# Patient Record
Sex: Male | Born: 1965 | Race: White | Hispanic: Yes | Smoking: Current every day smoker
Health system: Southern US, Community
[De-identification: ages and names within clinical notes are randomized; demographics above are authoritative.]

## PROBLEM LIST (undated history)

## (undated) DIAGNOSIS — G4733 Obstructive sleep apnea (adult) (pediatric): Secondary | ICD-10-CM

## (undated) DIAGNOSIS — I10 Essential (primary) hypertension: Secondary | ICD-10-CM

## (undated) DIAGNOSIS — E7800 Pure hypercholesterolemia, unspecified: Secondary | ICD-10-CM

## (undated) DIAGNOSIS — J45909 Unspecified asthma, uncomplicated: Secondary | ICD-10-CM

## (undated) DIAGNOSIS — F419 Anxiety disorder, unspecified: Secondary | ICD-10-CM

## (undated) DIAGNOSIS — F32A Depression, unspecified: Secondary | ICD-10-CM

## (undated) DIAGNOSIS — I4891 Unspecified atrial fibrillation: Secondary | ICD-10-CM

## (undated) DIAGNOSIS — G4730 Sleep apnea, unspecified: Secondary | ICD-10-CM

## (undated) HISTORY — PX: APPENDECTOMY: SHX54

## (undated) HISTORY — PX: CHOLECYSTECTOMY: SHX55

## (undated) HISTORY — DX: Unspecified asthma, uncomplicated: J45.909

## (undated) HISTORY — PX: TOTAL SHOULDER REPLACEMENT: SUR1217

## (undated) HISTORY — DX: Sleep apnea, unspecified: G47.30

## (undated) HISTORY — DX: Depression, unspecified: F32.A

---

## 2014-05-02 ENCOUNTER — Emergency Department (HOSPITAL_BASED_OUTPATIENT_CLINIC_OR_DEPARTMENT_OTHER): Payer: 59

## 2014-05-02 ENCOUNTER — Observation Stay (HOSPITAL_BASED_OUTPATIENT_CLINIC_OR_DEPARTMENT_OTHER)
Admission: EM | Admit: 2014-05-02 | Discharge: 2014-05-03 | Disposition: A | Discharge status: Home / Self Care | Payer: 59 | Attending: Cardiovascular Disease | Admitting: Cardiovascular Disease

## 2014-05-02 ENCOUNTER — Encounter (HOSPITAL_BASED_OUTPATIENT_CLINIC_OR_DEPARTMENT_OTHER): Payer: Self-pay | Admitting: Emergency Medicine

## 2014-05-02 DIAGNOSIS — I480 Paroxysmal atrial fibrillation: Secondary | ICD-10-CM | POA: Diagnosis not present

## 2014-05-02 DIAGNOSIS — F15920 Other stimulant use, unspecified with intoxication, uncomplicated: Secondary | ICD-10-CM | POA: Insufficient documentation

## 2014-05-02 DIAGNOSIS — Z6834 Body mass index (BMI) 34.0-34.9, adult: Secondary | ICD-10-CM | POA: Diagnosis not present

## 2014-05-02 DIAGNOSIS — E780 Pure hypercholesterolemia: Secondary | ICD-10-CM | POA: Diagnosis not present

## 2014-05-02 DIAGNOSIS — F17210 Nicotine dependence, cigarettes, uncomplicated: Secondary | ICD-10-CM | POA: Diagnosis not present

## 2014-05-02 DIAGNOSIS — E669 Obesity, unspecified: Secondary | ICD-10-CM | POA: Diagnosis present

## 2014-05-02 DIAGNOSIS — I4891 Unspecified atrial fibrillation: Secondary | ICD-10-CM | POA: Diagnosis present

## 2014-05-02 DIAGNOSIS — Z720 Tobacco use: Secondary | ICD-10-CM | POA: Diagnosis present

## 2014-05-02 DIAGNOSIS — I499 Cardiac arrhythmia, unspecified: Secondary | ICD-10-CM | POA: Diagnosis present

## 2014-05-02 DIAGNOSIS — F419 Anxiety disorder, unspecified: Secondary | ICD-10-CM | POA: Diagnosis not present

## 2014-05-02 DIAGNOSIS — E785 Hyperlipidemia, unspecified: Secondary | ICD-10-CM | POA: Diagnosis present

## 2014-05-02 DIAGNOSIS — R002 Palpitations: Secondary | ICD-10-CM

## 2014-05-02 DIAGNOSIS — Z789 Other specified health status: Secondary | ICD-10-CM | POA: Diagnosis present

## 2014-05-02 HISTORY — DX: Pure hypercholesterolemia, unspecified: E78.00

## 2014-05-02 HISTORY — DX: Anxiety disorder, unspecified: F41.9

## 2014-05-02 HISTORY — DX: Unspecified atrial fibrillation: I48.91

## 2014-05-02 LAB — BASIC METABOLIC PANEL
Anion gap: 16 — ABNORMAL HIGH (ref 5–15)
BUN: 19 mg/dL (ref 6–23)
CO2: 23 mEq/L (ref 19–32)
CREATININE: 1 mg/dL (ref 0.50–1.35)
Calcium: 9.5 mg/dL (ref 8.4–10.5)
Chloride: 101 mEq/L (ref 96–112)
GFR calc Af Amer: >90 mL/min (ref >90)
GFR calc non Af Amer: 89 mL/min — ABNORMAL LOW (ref >90)
Glucose, Bld: 137 mg/dL — ABNORMAL HIGH (ref 70–99)
Potassium: 4.1 mEq/L (ref 3.7–5.3)
Sodium: 140 mEq/L (ref 137–147)

## 2014-05-02 LAB — CBC WITH DIFFERENTIAL/PLATELET
Basophils Absolute: 0 10*3/uL (ref 0.0–0.1)
Basophils Relative: 0 % (ref 0–1)
Eosinophils Absolute: 0.1 10*3/uL (ref 0.0–0.7)
Eosinophils Relative: 1 % (ref 0–5)
HCT: 46.4 % (ref 39.0–52.0)
HEMOGLOBIN: 16.5 g/dL (ref 13.0–17.0)
LYMPHS ABS: 4.4 10*3/uL — AB (ref 0.7–4.0)
LYMPHS PCT: 47 % — AB (ref 12–46)
MCH: 30.9 pg (ref 26.0–34.0)
MCHC: 35.6 g/dL (ref 30.0–36.0)
MCV: 86.9 fL (ref 78.0–100.0)
MONO ABS: 0.6 10*3/uL (ref 0.1–1.0)
MONOS PCT: 6 % (ref 3–12)
NEUTROS ABS: 4.4 10*3/uL (ref 1.7–7.7)
Neutrophils Relative %: 46 % (ref 43–77)
Platelets: 272 10*3/uL (ref 150–400)
RBC: 5.34 MIL/uL (ref 4.22–5.81)
RDW: 12.9 % (ref 11.5–15.5)
WBC: 9.5 10*3/uL (ref 4.0–10.5)

## 2014-05-02 LAB — TROPONIN I: Troponin I: <0.3 ng/mL (ref <0.30)

## 2014-05-02 LAB — MRSA PCR SCREENING: MRSA BY PCR: NEGATIVE

## 2014-05-02 LAB — T4, FREE: Free T4: 0.93 ng/dL (ref 0.80–1.80)

## 2014-05-02 LAB — PRO B NATRIURETIC PEPTIDE: Pro B Natriuretic peptide (BNP): 656.8 pg/mL — ABNORMAL HIGH (ref 0–125)

## 2014-05-02 LAB — D-DIMER, QUANTITATIVE: D-Dimer, Quant: <0.27 ug/mL-FEU (ref 0.00–0.48)

## 2014-05-02 LAB — TSH: TSH: 1.55 u[IU]/mL (ref 0.350–4.500)

## 2014-05-02 MED ORDER — DILTIAZEM HCL 25 MG/5ML IV SOLN
20.0000 mg | Freq: Once | INTRAVENOUS | Status: AC
  Administered 2014-05-02: 20 mg via INTRAVENOUS
  Filled 2014-05-02: qty 5

## 2014-05-02 MED ORDER — DILTIAZEM HCL 100 MG IV SOLR
5.0000 mg/h | INTRAVENOUS | Status: DC
  Administered 2014-05-02: 2.5 mg/h via INTRAVENOUS

## 2014-05-02 MED ORDER — ACETAMINOPHEN 325 MG PO TABS
650.0000 mg | ORAL_TABLET | Freq: Once | ORAL | Status: AC
  Administered 2014-05-02: 650 mg via ORAL
  Filled 2014-05-02: qty 2

## 2014-05-02 MED ORDER — ATORVASTATIN CALCIUM 20 MG PO TABS
20.0000 mg | ORAL_TABLET | Freq: Every day | ORAL | Status: DC
  Filled 2014-05-02: qty 1

## 2014-05-02 MED ORDER — ASPIRIN EC 81 MG PO TBEC
81.0000 mg | DELAYED_RELEASE_TABLET | Freq: Every day | ORAL | Status: DC
  Administered 2014-05-03: 81 mg via ORAL
  Filled 2014-05-02: qty 1

## 2014-05-02 MED ORDER — SODIUM CHLORIDE 0.9 % IV SOLN
Freq: Once | INTRAVENOUS | Status: AC
  Administered 2014-05-02: 16:00:00 via INTRAVENOUS

## 2014-05-02 MED ORDER — ASPIRIN 81 MG PO CHEW
324.0000 mg | CHEWABLE_TABLET | Freq: Once | ORAL | Status: AC
  Administered 2014-05-02: 324 mg via ORAL
  Filled 2014-05-02: qty 4

## 2014-05-02 MED ORDER — DILTIAZEM HCL 25 MG/5ML IV SOLN
INTRAVENOUS | Status: AC
  Filled 2014-05-02: qty 20

## 2014-05-02 MED ORDER — SODIUM CHLORIDE 0.9 % IV BOLUS (SEPSIS)
1000.0000 mL | Freq: Once | INTRAVENOUS | Status: AC
  Administered 2014-05-02: 1000 mL via INTRAVENOUS

## 2014-05-02 MED ORDER — ENOXAPARIN SODIUM 120 MG/0.8ML ~~LOC~~ SOLN
1.0000 mg/kg | Freq: Once | SUBCUTANEOUS | Status: AC
  Administered 2014-05-02: 115 mg via SUBCUTANEOUS
  Filled 2014-05-02: qty 0.8

## 2014-05-02 MED ORDER — ENOXAPARIN SODIUM 120 MG/0.8ML ~~LOC~~ SOLN
115.0000 mg | Freq: Two times a day (BID) | SUBCUTANEOUS | Status: DC
  Administered 2014-05-03: 115 mg via SUBCUTANEOUS
  Filled 2014-05-02 (×3): qty 0.8

## 2014-05-02 MED ORDER — PAROXETINE HCL 20 MG PO TABS
40.0000 mg | ORAL_TABLET | ORAL | Status: DC
  Administered 2014-05-03: 40 mg via ORAL
  Filled 2014-05-02 (×2): qty 2

## 2014-05-02 NOTE — ED Notes (Signed)
EDP Pfeiffer notified of pt's report of chest pain- EKG ordered and in process

## 2014-05-02 NOTE — ED Provider Notes (Signed)
CSN: [id number]     Arrival date & time 05/02/14  1243 History   First MD Initiated Contact with Patient 05/02/14 1300     Chief Complaint  Patient presents with  . Irregular Heart Beat     (Consider location/radiation/quality/duration/timing/severity/associated sxs/prior Treatment) HPI Comments: 2 days of constant tachypalpitations. He has had chronic unchanged neck/back pain she believes is radiating up to his head and causing a headache. He has had generalized fatigue, no shortness of breath this is unchanged with exertion or rest. Around 10 AM he had one to 2 seconds of a sharp midsternal chest pain which has since resolved and has not returned. Denies nausea, vomiting, diaphoresis, or extremity pain or swelling. He has had no recent prolonged travel. He smokes occasionally. He drinks multiple caffeinated beverages a day. He denies IV drug use. His no family history of PE/DVT, cardiac arrhythmia.   Past Medical History  Diagnosis Date  . Anxiety   . Elevated cholesterol    Past Surgical History  Procedure Laterality Date  . Appendectomy    . Cholecystectomy     No family history on file. History  Substance Use Topics  . Smoking status: Current Some Day Smoker  . Smokeless tobacco: Not on file  . Alcohol Use: Yes     Comment: occassionally    Review of Systems  Constitutional: Negative for fever, activity change, appetite change and fatigue.  HENT: Negative for congestion, facial swelling, rhinorrhea and trouble swallowing.   Eyes: Negative for photophobia and pain.  Respiratory: Positive for shortness of breath. Negative for cough and chest tightness.   Cardiovascular: Positive for chest pain and palpitations. Negative for leg swelling.  Gastrointestinal: Negative for nausea, vomiting, abdominal pain, diarrhea and constipation.  Endocrine: Negative for polydipsia and polyuria.  Genitourinary: Negative for dysuria, urgency, decreased urine volume and difficulty urinating.   Musculoskeletal: Negative for back pain and gait problem.  Skin: Negative for color change, rash and wound.  Allergic/Immunologic: Negative for immunocompromised state.  Neurological: Negative for dizziness, facial asymmetry, speech difficulty, weakness, numbness and headaches.  Psychiatric/Behavioral: Negative for confusion, decreased concentration and agitation.      Allergies  Review of patient's allergies indicates no known allergies.  Home Medications   Prior to Admission medications   Medication Sig Start Date End Date Taking? Authorizing Provider  Atorvastatin Calcium (LIPITOR PO) Take by mouth.   Yes Historical Provider, MD  PARoxetine (PAXIL) 40 MG tablet Take 40 mg by mouth every morning.   Yes Historical Provider, MD   BP 147/87  Pulse 88  Temp(Src) 98.8 F (37.1 C) (Oral)  Resp 13  Ht 5\' 11"  (1.803 m)  Wt 250 lb (113.399 kg)  BMI 34.88 kg/m2  SpO2 100% Physical Exam  Constitutional: He is oriented to person, place, and time. He appears well-developed and well-nourished. No distress.  HENT:  Head: Normocephalic and atraumatic.  Mouth/Throat: No oropharyngeal exudate.  Eyes: Pupils are equal, round, and reactive to light.  Neck: Normal range of motion. Neck supple.  Cardiovascular: Normal heart sounds.  An irregularly irregular rhythm present. Tachycardia present.  Exam reveals no gallop and no friction rub.   No murmur heard. Pulmonary/Chest: Effort normal and breath sounds normal. No respiratory distress. He has no wheezes. He has no rales.  Abdominal: Soft. Bowel sounds are normal. He exhibits no distension and no mass. There is no tenderness. There is no rebound and no guarding.  Musculoskeletal: Normal range of motion. He exhibits no edema and no tenderness.  Neurological: He is alert and oriented to person, place, and time.  Skin: Skin is warm and dry.  Psychiatric: He has a normal mood and affect.    ED Course  Procedures (including critical care  time) Labs Review Labs Reviewed  CBC WITH DIFFERENTIAL - Abnormal; Notable for the following:    Lymphocytes Relative 47 (*)    Lymphs Abs 4.4 (*)    All other components within normal limits  BASIC METABOLIC PANEL - Abnormal; Notable for the following:    Glucose, Bld 137 (*)    GFR calc non Af Amer 89 (*)    Anion gap 16 (*)    All other components within normal limits  PRO B NATRIURETIC PEPTIDE - Abnormal; Notable for the following:    Pro B Natriuretic peptide (BNP) 656.8 (*)    All other components within normal limits  TROPONIN I  T4, FREE  D-DIMER, QUANTITATIVE  TSH    Imaging Review Dg Chest 2 View  05/02/2014   CLINICAL DATA:  Sharp chest pain with difficulty breathing and tachycardia for 2 days  EXAM: CHEST  2 VIEW  COMPARISON:  None.  FINDINGS: The lungs are clear. Heart size and pulmonary vascularity are normal. No adenopathy. No pneumothorax. No bone lesions.  IMPRESSION: No edema or consolidation.   Electronically Signed   By: Bretta BangWilliam  Woodruff M.D.   On: 05/02/2014 13:40     EKG Interpretation   Date/Time:  Monday May 02 2014 12:50:22 EDT Ventricular Rate:  159 PR Interval:    QRS Duration: 90 QT Interval:  284 QTC Calculation: 461 R Axis:   69 Text Interpretation:  Atrial fibrillation with rapid ventricular response  Abnormal ECG No prior for comparison Confirmed by DOCHERTY  MD, MEGAN  ([id number]) on 05/02/2014 1:00:04 PM      MDM   Final diagnoses:  Palpitations  Atrial fibrillation with rapid ventricular response   Pt is a 48 y.o. male with Pmhx as above who presents with palpitations, mild shortness of breath and fatigue. This is no worse with exertion. Around 10:00 today at rest he had one to 2 seconds worth of sharp chest pain that has since resolved. On exam heart rate is 120s to 150s with atrial fibrillation. Lungs clear. No lower extremity pain or swelling. He is an occasional smoker, otherwise has no risk factors for PE. IV fluid bolus given  without improvement. CBC grossly unremarkable. BMP also grossly unremarkable. Troponin is negative. BNP is 656.8. Chest x-ray nml. D-dimer neg. TSH and free T4 sent. Rate improved to 80=90's with 20mg  IV dil and IVF bolus, remains in afib. I spoke w/ Dr. Elease HashimotoNahser with cardiology who recommended starting diltiazem drip and admitted to step down on Mrs. Cohen. Would also recommend starting Eliquis, however we do not have this at our facility.       Toy CookeyMegan Docherty, MD 05/02/14 1447

## 2014-05-02 NOTE — H&P (Signed)
History and Physical  Patient ID: Samuel Patel MRN: [id number], SOB: 06/10/1966 48 y.o. Date of Encounter: 05/02/2014, 9:26 PM  Primary Physician: [name], MD Primary Cardiologist: unassigned  Chief Complaint: palpitations  HPI: 48 y.o. male w/ PMHx significant for hyperlipidemia who [facility] 05[facility]2014 with complaints of palpitations. He notes that these started on Saturday while cooking a cake. Felt his heart fluttering and noted that his breathing was a little different. Symptoms persisted prompting a visit to [facility] today.  Previously, has had short runs of palpitations that he had evaluated 3 years ago with a holter and an echo (he reports these were normal).  No family history of arrhythmias or heart problems. ETOH of `1-2 max drinks on a Friday. 1/4 ppd tobacco. Denies illicits of cocaine or meth. Does drink excessive caffeine of over 8 cups per day equivalent of coffee and diet soda.   EKG revealed afib with RVR. CXR was without acute cardiopulmonary abnormalities. Labs are significant for nl D-dimer and TSH.  Currently on dilt gtt at 5, rates of 90s.   Past Medical History  Diagnosis Date  . Anxiety   . Elevated cholesterol      Surgical History:  Past Surgical History  Procedure Laterality Date  . Appendectomy    . Cholecystectomy       Home Meds: Prior to Admission medications   Medication Sig Start Date End Date Taking? Authorizing Provider  Atorvastatin Calcium (LIPITOR PO) Take by mouth.   Yes Historical Provider, MD  PARoxetine (PAXIL) 40 MG tablet Take 40 mg by mouth every morning.   Yes Historical Provider, MD    Allergies: No Known Allergies  History   Social History  . Marital Status: Divorced    Spouse Name: N/A    Number of Children: N/A  . Years of Education: N/A   Occupational History  . Not on file.   Social History Main Topics  . Smoking status: Current Some Day Smoker  . Smokeless  tobacco: Not on file  . Alcohol Use: Yes     Comment: occassionally  . Drug Use: No  . Sexual Activity: Not on file   Other Topics Concern  . Not on file   Social History Narrative  . No narrative on file     FH: neg for CV dz.  Review of Systems: General: negative for chills, fever, night sweats or weight changes.  Cardiovascular: see HPI Dermatological: negative for rash Respiratory: negative for cough or wheezing Urologic: negative for hematuria Abdominal: negative for nausea, vomiting, diarrhea, bright red blood per rectum, melena, or hematemesis Neurologic: negative for visual changes, syncope, or dizziness All other systems reviewed and are otherwise negative except as noted above.  Labs:   Lab Results  Component Value Date   WBC 9.5 05/02/2014   HGB 16.5 05/02/2014   HCT 46.4 05/02/2014   MCV 86.9 05/02/2014   PLT 272 05/02/2014    Recent Labs Lab 05/02/14 1255  NA 140  K 4.1  CL 101  CO2 23  BUN 19  CREATININE 1.00  CALCIUM 9.5  GLUCOSE 137*    Recent Labs  05/02/14 1255 05/02/14 1740  TROPONINI <0.30 <0.30   No results found for this basename: CHOL, HDL, LDLCALC, TRIG   Lab Results  Component Value Date   DDIMER <0.27 05/02/2014    Radiology/Studies:  Dg Chest 2 View  05/02/2014   CLINICAL DATA:  Sharp chest pain with difficulty breathing and  tachycardia for 2 days  EXAM: CHEST  2 VIEW  COMPARISON:  None.  FINDINGS: The lungs are clear. Heart size and pulmonary vascularity are normal. No adenopathy. No pneumothorax. No bone lesions.  IMPRESSION: No edema or consolidation.   Electronically Signed   By: Bretta BangWilliam  Woodruff M.D.   On: 05/02/2014 13:40     EKG: afib with RVR  Physical Exam: Blood pressure 127/94, pulse 106, temperature 98 F (36.7 C), temperature source Oral, resp. rate 14, height 5\' 11"  (1.803 m), weight 113.399 kg (250 lb), SpO2 100.00%. General: Well developed, well nourished, in no acute distress. Head: Normocephalic,  atraumatic, sclera non-icteric, nares are without discharge Neck: Supple. Negative for carotid bruits. JVD not elevated. Lungs: Clear bilaterally to auscultation without wheezes, rales, or rhonchi. Breathing is unlabored. Heart: irreg, irreg. No murmurs, rubs, or gallops appreciated. Abdomen: Soft, non-tender, non-distended with normoactive bowel sounds. No rebound/guarding. No obvious abdominal masses. Msk:  Strength and tone appear normal for age. Extremities: No edema. No clubbing or cyanosis. Distal pedal pulses are 2+ and equal bilaterally. Neuro: Alert and oriented X 3. Moves all extremities spontaneously. Psych:  Responds to questions appropriately with a normal affect.    ASSESSMENT AND PLAN:  1. Afib with RVR 2. Hyperlipidemia 3. Anxiety 4. Excessive caffeine 5. Tobacco abuse  48 y.o. male w/ PMHx significant for hyperlipidemia who [facility]2014 with complaints of palpitations x 2 days+ --> found to be in afib with RVR.  Regarding etiology, preliminary workup is negative (d-dimer, thyroid). Interestingly, it sounds like he has had an echo in the past for palpitations workup which was normal. Repeat echo pending. The data regarding caffeine is not clear but 8 cups + is probably not helping his arrhythmia. Recommended cutting down to more reasonable amount.  At his age, preference for rhythm control. May convert on his own with diltiazem gtt. Due to greater than 2 days in length, will need TEE prior to cardioversion. Lovenox for anticoagulation for now. (Other strategy of 3 weeks anti-coag then DCCV also reasonable- discussed both strategies with patient). NPO in case procedure needed.  Smoking cessation strongly recommended.,  COntinue statin and paxil.  Full code.   Signed, Adolm JosephWHITLOCK, Troy SineMATTHEW C. MD 05/02/2014, 9:26 PM

## 2014-05-02 NOTE — ED Notes (Signed)
Patient transported to X-ray- RN with pt- pt on monitor

## 2014-05-02 NOTE — Progress Notes (Signed)
ANTICOAGULATION CONSULT NOTE - Initial Consult  Pharmacy Consult for Enoxaparin Indication: atrial fibrillation  No Known Allergies  Patient Measurements: Height: 5\' 11"  (180.3 cm) Weight: 250 lb (113.399 kg) IBW/kg (Calculated) : 75.3  Vital Signs: Temp: 98 F (36.7 C) (10/12 1945) Temp Source: Oral (10/12 1945) BP: 127/94 mmHg (10/12 1945) Pulse Rate: 106 (10/12 1945)  Labs:  Recent Labs  05/02/14 1255 05/02/14 1740  HGB 16.5  --   HCT 46.4  --   PLT 272  --   CREATININE 1.00  --   TROPONINI <0.30 <0.30    Estimated Creatinine Clearance: 118.2 ml/min (by C-G formula based on Cr of 1).   Medical History: Past Medical History  Diagnosis Date  . Anxiety   . Elevated cholesterol     Medications:  Prescriptions prior to admission  Medication Sig Dispense Refill  . Atorvastatin Calcium (LIPITOR PO) Take by mouth.      Marland Kitchen. PARoxetine (PAXIL) 40 MG tablet Take 40 mg by mouth every morning.        Assessment: 48 yo M admitted 05/02/2014 with 2d of a rapid heart rate.  Pharmacy consulted to start enoxaparin.  PMH: anxiety, hypercholesterolemia  Coag: Afib, to start enoxaparin, CrCl > 100 ml/min, CBC wnl  Goal of Therapy:  Anti-Xa level 0.6-1 units/ml 4hrs after LMWH dose given Monitor platelets by anticoagulation protocol: Yes   Plan:  Lovenox 115 mg SQ q12h CBC Q72h  Thank you for allowing pharmacy to be a part of this patients care team.  Lovenia KimJulie Theodoro Koval Pharm.D., BCPS, AQ-Cardiology Clinical Pharmacist 05/02/2014 9:52 PM Pager: [phone] Phone: [phone]

## 2014-05-02 NOTE — ED Notes (Signed)
MD at bedside. 

## 2014-05-02 NOTE — ED Notes (Signed)
Patient states he developed a rapid heart beat two days ago on Saturday.  States he also has had a headache for the last two days, shortness of breath, & fatigue.  States earlier today he had a sharp pain in the center of his chest which lasted for a few seconds.  Denies nausea or diaphoresis.

## 2014-05-02 NOTE — ED Notes (Signed)
Report given to [place], RN on [id number]

## 2014-05-02 NOTE — ED Notes (Signed)
Pt c/o increased palpitations, MD notified and informed to increase rate to 5

## 2014-05-02 NOTE — H&P (Signed)
The history and physical has been done by Dr. Adolm JosephWhitlock.

## 2014-05-02 NOTE — ED Notes (Signed)
MD notified about pts new cp

## 2014-05-02 NOTE — ED Notes (Addendum)
MD at bedside discussing admission

## 2014-05-02 NOTE — ED Notes (Signed)
Pt c/o headache which he states he has had pta- EDP notified

## 2014-05-02 NOTE — ED Notes (Signed)
Pt sts that his cp is less now than it was.

## 2014-05-02 NOTE — ED Provider Notes (Signed)
I taken over care of the patient as he awaits transfer for atrial fibrillation on Cardizem drip. Nursing reports that the patient complained of some central chest pressure, while heart rate and blood pressures remained stable. I have reassessed the patient and find him to be in good condition with excellent mental status, no respiratory distress, skin warm and dry. At this time I have added aspirin and reviewed the patient's repeat EKG. Repeat EKG shows atrial fibrillation but no ST-T wave changes or acute ischemic appearance. Reviewing the previous note, the patient was recommended to get Eliquis but this was not available at the facility. At this time I will add Lovenox and a repeat troponin.  Samuel BarretteMarcy Kadelyn Dimascio, MD 05/02/14 [phone]

## 2014-05-02 NOTE — ED Notes (Signed)
Attempted to call report to floor but nurse was unavailable.  They requested to call us back.

## 2014-05-03 ENCOUNTER — Encounter (HOSPITAL_COMMUNITY): Payer: Self-pay | Admitting: *Deleted

## 2014-05-03 DIAGNOSIS — E669 Obesity, unspecified: Secondary | ICD-10-CM | POA: Diagnosis not present

## 2014-05-03 DIAGNOSIS — I480 Paroxysmal atrial fibrillation: Secondary | ICD-10-CM | POA: Diagnosis not present

## 2014-05-03 DIAGNOSIS — E785 Hyperlipidemia, unspecified: Secondary | ICD-10-CM | POA: Diagnosis present

## 2014-05-03 DIAGNOSIS — E780 Pure hypercholesterolemia: Secondary | ICD-10-CM | POA: Diagnosis not present

## 2014-05-03 DIAGNOSIS — Z6834 Body mass index (BMI) 34.0-34.9, adult: Secondary | ICD-10-CM | POA: Diagnosis not present

## 2014-05-03 DIAGNOSIS — I4891 Unspecified atrial fibrillation: Secondary | ICD-10-CM

## 2014-05-03 DIAGNOSIS — F419 Anxiety disorder, unspecified: Secondary | ICD-10-CM | POA: Diagnosis present

## 2014-05-03 DIAGNOSIS — Z789 Other specified health status: Secondary | ICD-10-CM | POA: Diagnosis present

## 2014-05-03 DIAGNOSIS — Z720 Tobacco use: Secondary | ICD-10-CM | POA: Diagnosis present

## 2014-05-03 DIAGNOSIS — I517 Cardiomegaly: Secondary | ICD-10-CM

## 2014-05-03 LAB — CBC
HCT: 40.7 % (ref 39.0–52.0)
Hemoglobin: 14.4 g/dL (ref 13.0–17.0)
MCH: 31.2 pg (ref 26.0–34.0)
MCHC: 35.4 g/dL (ref 30.0–36.0)
MCV: 88.3 fL (ref 78.0–100.0)
Platelets: 217 10*3/uL (ref 150–400)
RBC: 4.61 MIL/uL (ref 4.22–5.81)
RDW: 13.1 % (ref 11.5–15.5)
WBC: 9.1 10*3/uL (ref 4.0–10.5)

## 2014-05-03 LAB — BASIC METABOLIC PANEL
Anion gap: 12 (ref 5–15)
BUN: 13 mg/dL (ref 6–23)
CO2: 23 mEq/L (ref 19–32)
Calcium: 8.6 mg/dL (ref 8.4–10.5)
Chloride: 102 mEq/L (ref 96–112)
Creatinine, Ser: 0.89 mg/dL (ref 0.50–1.35)
GFR calc Af Amer: >90 mL/min (ref >90)
GLUCOSE: 108 mg/dL — AB (ref 70–99)
POTASSIUM: 3.9 meq/L (ref 3.7–5.3)
Sodium: 137 mEq/L (ref 137–147)

## 2014-05-03 LAB — HEMOGLOBIN A1C
Hgb A1c MFr Bld: 5.7 % — ABNORMAL HIGH (ref <5.7)
Mean Plasma Glucose: 117 mg/dL — ABNORMAL HIGH (ref <117)

## 2014-05-03 LAB — TROPONIN I: Troponin I: <0.3 ng/mL (ref <0.30)

## 2014-05-03 MED ORDER — ASPIRIN 81 MG PO TBEC: 81.0000 mg | DELAYED_RELEASE_TABLET | Freq: Every day | ORAL | Status: DC

## 2014-05-03 MED ORDER — ACETAMINOPHEN 325 MG PO TABS: 650.0000 mg | ORAL_TABLET | ORAL | Status: DC | PRN

## 2014-05-03 MED ORDER — METOPROLOL SUCCINATE 12.5 MG HALF TABLET
12.5000 mg | ORAL_TABLET | Freq: Every day | ORAL | Status: DC
  Administered 2014-05-03: 12.5 mg via ORAL
  Filled 2014-05-03: qty 1

## 2014-05-03 MED ORDER — METOPROLOL SUCCINATE ER 25 MG PO TB24
12.5000 mg | ORAL_TABLET | Freq: Every day | ORAL | Status: DC
Start: 2014-05-03 — End: 2014-06-03

## 2014-05-03 NOTE — Progress Notes (Signed)
UR completed 

## 2014-05-03 NOTE — Progress Notes (Signed)
  Echocardiogram 2D Echocardiogram has been performed.  Arvil ChacoFoster, Sheera Illingworth 05/03/2014, 11:23 AM

## 2014-05-03 NOTE — Discharge Summary (Signed)
Physician Discharge Summary     Cardiologist:  Brackbill(new) Patient ID: Samuel Patel MRN: [id number] DOB/AGE: 46/02/1966 46 y.o.  Admit date: 05/02/2014 Discharge date: 05/03/2014  Admission Diagnoses:  Atrial fibrillation with RVR  Discharge Diagnoses:  Principal Problem:   Atrial fibrillation with rapid ventricular response   Obesity   Tobacco abuse   HLD   Excessive Caffeine   Anxiety  Discharged Condition: stable  Hospital Course:   48 y.o. male w/ PMHx significant for hyperlipidemia and obesity who [facility] to [facility] on 05/02/2014 with complaints of palpitations. He notes that these started on Saturday while cooking a cake. Felt his heart fluttering and noted that his breathing was a little different. Symptoms persisted prompting a visit to [facility].  Previously, has had short runs of palpitations that he had evaluated 3 years ago with a holter and an echo (he reports these were normal).   No family history of arrhythmias or heart problems. ETOH of `1-2 max drinks on a Friday. 1/4 ppd tobacco. Denies illicits of cocaine or meth. Does drink excessive caffeine of over 8 cups per day equivalent of coffee and diet soda.  EKG revealed afib with RVR. CXR was without acute cardiopulmonary abnormalities. Labs are significant for nl D-dimer and TSH.   He was started on a diltiazem gtt at 5, and rates dropped to the 90s.    He was admitted and continued on IV diltiazem.  Lovenox was started and then change to 81mg  ASA.  CHADSVASC 0.  Tobacco cessation was strongly encouraged as was the importance of avoiding sweets and losing weight.  He spontaneously converted to NSR.  Dilt was changed to 12.5 mg Toprol daily.  2D echocardiogram revealed an EF of  55-60%, LA mildly dilated.  The patient was seen by Dr. Patty SermonsBrackbill who felt she was stable for DC home.     Consults:    Significant Diagnostic Studies:  Echocardiogram.     Treatments: See above  Discharge  Exam: Blood pressure 137/98, pulse 100, temperature 98.4 F (36.9 C), temperature source Oral, resp. rate 18, height 6' (1.829 m), weight 267 lb (121.11 kg), SpO2 97.00%.   Disposition: 01-Home or Self Care      Discharge Instructions   Diet - low sodium heart healthy    Complete by:  As directed             Medication List         aspirin 81 MG EC tablet  Take 1 tablet (81 mg total) by mouth daily.     atorvastatin 10 MG tablet  Commonly known as:  LIPITOR  Take 10 mg by mouth daily.     metoprolol succinate 25 MG 24 hr tablet  Commonly known as:  TOPROL-XL  Take 0.5 tablets (12.5 mg total) by mouth daily.     PARoxetine 40 MG tablet  Commonly known as:  PAXIL  Take 40 mg by mouth every morning.       Follow-up Information   Follow up with Cassell Clementhomas Brackbill, MD On 06/03/2014. (10:30 AM)    Specialty:  Cardiology   Contact information:   [address] [place] [address] [phone]      Greater than 30 minutes was spent completing the patient's discharge.   SignedWilburt Finlay: Alissia Lory, PAC 05/03/2014, 1:41 PM

## 2014-05-03 NOTE — Progress Notes (Signed)
DC orders received.  Patient stable with no S/S of distress.  Medication and discharge information reviewed with patient.  Patient DC home. Samanta Gal Marie  

## 2014-05-03 NOTE — Progress Notes (Signed)
Patient converted to NSR, verified by 12 lead EKG.  Dr. Patty SermonsBrackbill notified. [place], Mitzi HansenJessica Marie

## 2014-05-03 NOTE — Progress Notes (Signed)
Patient Name: Samuel Patel Date of Encounter: 05/03/2014     Principal Problem:   Atrial fibrillation with rapid ventricular response Active Problems:   Atrial fibrillation    SUBJECTIVE  Patient feels well. He converted to NSR overnight on IV diltiazem. 2D echo pending this am.  CURRENT MEDS . aspirin EC  81 mg Oral Daily  . atorvastatin  20 mg Oral q1800  . enoxaparin (LOVENOX) injection  115 mg Subcutaneous Q12H  . PARoxetine  40 mg Oral BH-q7a    OBJECTIVE  Filed Vitals:   05/03/14 0300 05/03/14 0400 05/03/14 0500 05/03/14 0824  BP: 121/78 121/78  123/81  Pulse: 85 93 100   Temp: 98.1 F (36.7 C)   98 F (36.7 C)  TempSrc: Oral   Oral  Resp: 18     Height:      Weight:      SpO2: 97% 91% 98% 95%    Intake/Output Summary (Last 24 hours) at 05/03/14 0858 Last data filed at 05/03/14 0800  Gross per 24 hour  Intake 1370.88 ml  Output   1300 ml  Net  70.88 ml   Filed Weights   05/02/14 1303 05/02/14 2100  Weight: 250 lb (113.399 kg) 267 lb (121.11 kg)    PHYSICAL EXAM  General: Pleasant, NAD. Neuro: Alert and oriented X 3. Moves all extremities spontaneously. Psych: Normal affect. HEENT:  Normal  Neck: Supple without bruits or JVD. Lungs:  Resp regular and unlabored, CTA. Heart: RRR no s3, s4, or murmurs. Abdomen: Soft, non-tender, non-distended, BS + x 4.  Extremities: No clubbing, cyanosis or edema. DP/PT/Radials 2+ and equal bilaterally.  Accessory Clinical Findings  CBC  Recent Labs  05/02/14 1255 05/03/14 0358  WBC 9.5 9.1  NEUTROABS 4.4  --   HGB 16.5 14.4  HCT 46.4 40.7  MCV 86.9 88.3  PLT 272 217   Basic Metabolic Panel  Recent Labs  05/02/14 1255 05/03/14 0358  NA 140 137  K 4.1 3.9  CL 101 102  CO2 23 23  GLUCOSE 137* 108*  BUN 19 13  CREATININE 1.00 0.89  CALCIUM 9.5 8.6   Liver Function Tests No results found for this basename: AST, ALT, ALKPHOS, BILITOT, PROT, ALBUMIN,  in the last 72 hours No results found  for this basename: LIPASE, AMYLASE,  in the last 72 hours Cardiac Enzymes  Recent Labs  05/02/14 1740 05/02/14 2213 05/03/14 0358  TROPONINI <0.30 <0.30 <0.30   BNP No components found with this basename: POCBNP,  D-Dimer  Recent Labs  05/02/14 1255  DDIMER <0.27   Hemoglobin A1C No results found for this basename: HGBA1C,  in the last 72 hours Fasting Lipid Panel No results found for this basename: CHOL, HDL, LDLCALC, TRIG, CHOLHDL, LDLDIRECT,  in the last 72 hours Thyroid Function Tests  Recent Labs  05/02/14 1335  TSH 1.550    TELE  NSR  ECG  NSR WNL  Radiology/Studies  Dg Chest 2 View  05/02/2014   CLINICAL DATA:  Sharp chest pain with difficulty breathing and tachycardia for 2 days  EXAM: CHEST  2 VIEW  COMPARISON:  None.  FINDINGS: The lungs are clear. Heart size and pulmonary vascularity are normal. No adenopathy. No pneumothorax. No bone lesions.  IMPRESSION: No edema or consolidation.   Electronically Signed   By: Bretta BangWilliam  Woodruff M.D.   On: 05/02/2014 13:40    ASSESSMENT AND PLAN  1. Paroxysmal atrial fibrillation possibly related to high caffeine intake, resolved on IV  diltiazem. 2.Hyperlipidemia on statin. 3. Hyperglycemia, mild. Not known to be diabetic. 4. Anxiety. 5. Tobacco abuse  Plan:  Await echo. DC IV diltiazem and begin Toprol 12.5 mg daily. Chadssvasc score is 0 (or 1 if diabetic). Will send home on ASA 81 mg. Spoke with him about avoiding sweets, importance of quitting smoking and losing weight.   Probable DC home this afternoon after echo is back. [place] in the [place] I can see back in my office in about 1 month for cardiac followup.  Signed, Cassell Clementhomas Barbee Mamula MD

## 2014-06-03 ENCOUNTER — Encounter: Payer: Self-pay | Admitting: Cardiology

## 2014-06-03 ENCOUNTER — Ambulatory Visit (INDEPENDENT_AMBULATORY_CARE_PROVIDER_SITE_OTHER): Payer: 59 | Admitting: Cardiology

## 2014-06-03 VITALS — BP 128/94 | HR 60 | Ht 72.0 in | Wt 268.2 lb

## 2014-06-03 DIAGNOSIS — I480 Paroxysmal atrial fibrillation: Secondary | ICD-10-CM

## 2014-06-03 MED ORDER — METOPROLOL SUCCINATE ER 25 MG PO TB24: 25.0000 mg | ORAL_TABLET | Freq: Every day | ORAL | Status: DC

## 2014-06-03 NOTE — Progress Notes (Signed)
Samuel Patel Date of Birth:  11/20/1965 [facility] [address] [place], [place]  [address] [phone]  Fax   [phone]  HPI: This 48 year old gentleman is seen for a one-month follow-up office visit.  He has a past history of paroxysmal atrial fibrillation.  He has a chadsvasc*score of 0.he was admitted on 05/02/14 with atrial fibrillation of rapid ventricular response.  He was treated with a diltiazem drip.  His echocardiogram showed an ejection fraction of 55-60%.  He converted on IV diltiazem and was discharged the following morning.  Since hospitalization he has had 2 recurrences of palpitations each lasting only a few seconds.  He has not been expressing any chest pain.  His blood pressure today is slightly elevated on a dose of Toprol 12.5 mg daily. Additional medical history includes the fact that he has degenerative disc disease and is followed by Dr. Shon BatonBrooks.  He also has been diagnosed with bilateral carpal tunnel disease, severe on the right and moderate on the left. [facility]  [place].  The patient works in outside [employer]ales for [employer].  He characterizes it as a moderately stressful job.  Current Outpatient Prescriptions  Medication Sig Dispense Refill  . aspirin EC 81 MG EC tablet Take 1 tablet (81 mg total) by mouth daily.    Marland Kitchen. atorvastatin (LIPITOR) 10 MG tablet Take 10 mg by mouth daily.    . metoprolol succinate (TOPROL-XL) 25 MG 24 hr tablet Take 1 tablet (25 mg total) by mouth daily. 90 tablet 3  . PARoxetine (PAXIL) 40 MG tablet Take 40 mg by mouth every morning.     No current facility-administered medications for this visit.    No Known Allergies  Patient Active Problem List   Diagnosis Date Noted  . Tobacco abuse 05/03/2014  . Anxiety 05/03/2014  . HLD (hyperlipidemia) 05/03/2014  . Obesity (BMI 35.0-39.9 without comorbidity) 05/03/2014  .  Caffeine use 05/03/2014  . Atrial fibrillation with rapid ventricular response 05/02/2014  . Atrial fibrillation 05/02/2014    History  Smoking status  . Current Some Day Smoker  Smokeless tobacco  . Not on file    History  Alcohol Use  . Yes    Comment: occassionally    Family History  Problem Relation Age of Onset  . Diabetes Mother     Review of Systems: The patient denies any heat or cold intolerance.  No weight gain or weight loss.  The patient denies headaches or blurry vision.  There is no cough or sputum production.  The patient denies dizziness.  There is no hematuria or hematochezia.  The patient denies any muscle aches or arthritis.  The patient denies any rash.  The patient denies frequent falling or instability.  There is no history of depression or anxiety.  All other systems were reviewed and are negative.   Physical Exam: Filed Vitals:   06/03/14 1049  BP: 128/94  Pulse: 60  The patient appears to be in no distress.  Head and neck exam reveals that the pupils are equal and reactive.  The extraocular movements are full.  There is no scleral icterus.  Mouth and pharynx are benign.  No lymphadenopathy.  No carotid bruits.  The jugular venous pressure is normal.  Thyroid is not enlarged or tender.  Chest is clear to percussion and auscultation.  No rales or rhonchi.  Expansion of the chest is symmetrical.  Heart reveals no abnormal lift or heave.  First and second heart sounds are normal.  There is no murmur gallop rub or click.  The abdomen is soft and nontender.  Bowel sounds are normoactive.  There is no hepatosplenomegaly or mass.  There are no abdominal bruits.  Extremities reveal no phlebitis or edema.  Pedal pulses are good.  There is no cyanosis or clubbing.  Neurologic exam is normal strength and no lateralizing weakness.  No sensory deficits.  Integument reveals no rash  EKG today shows normal sinus rhythm and is within normal  limits   Assessment / Plan: 1. Paroxysmal atrial fibrillation.  Chads vascular score of 0-1(borderline hypertension) 2. Degenerative disc disease of the spine 3.  History of hyperlipidemia 4.  Past history of tobacco abuse and excessive caffeine 5.  Anxiety  Disposition: Continue current meds except increase Toprol to a full 25 mg tablet daily. Avoid tobacco.  Limit caffeine. Lose weight. Recheck 6 months for office visit and EKG

## 2014-06-03 NOTE — Patient Instructions (Signed)
INCREASE YOUR TOPROL (METOPROLOL) TO A FULL TABLET DAILY  Your physician wants you to follow-up in: 6 MONTH OV/EKG  You will receive a reminder letter in the mail two months in advance. If you don't receive a letter, please call our office to schedule the follow-up appointment.

## 2014-07-28 ENCOUNTER — Other Ambulatory Visit: Payer: Self-pay | Admitting: *Deleted

## 2014-07-28 MED ORDER — METOPROLOL SUCCINATE ER 25 MG PO TB24: 25.0000 mg | ORAL_TABLET | Freq: Every day | ORAL | Status: DC

## 2015-05-15 ENCOUNTER — Other Ambulatory Visit: Payer: Self-pay | Admitting: Cardiology

## 2015-09-22 ENCOUNTER — Other Ambulatory Visit: Payer: Self-pay | Admitting: Cardiology

## 2016-02-02 ENCOUNTER — Other Ambulatory Visit: Payer: Self-pay | Admitting: *Deleted

## 2016-03-21 ENCOUNTER — Encounter: Payer: Self-pay | Admitting: Cardiology

## 2016-03-21 NOTE — Progress Notes (Deleted)
Cardiology Office Note    Date:  03/21/2016   ID:  Samuel Patel, DOB 1966-07-02, MRN [id number]  PCP:  Samuel Singer, MD  Cardiologist:   Donato Schultz, MD     History of Present Illness:  Samuel Patel is a 50 y.o. male , patient of Dr. Yevonne Pax here for follow-up. He has a history of paroxysmal atrial fibrillation admitted in October 2015 with A. fib, RVR. Echocardiogram showed normal ejection fraction. Converted on IV diltiazem and was discharged in next day.  He's had minor palpitations lasting only a few seconds. Has been on low-dose Toprol.  He has had back pain, followed by Dr. Shon Baton, carpal tunnel syndrome and his primary physician actually is in [facility] He worked in [employer].    Past Medical History:  Diagnosis Date  . Anxiety   . Elevated cholesterol     Past Surgical History:  Procedure Laterality Date  . APPENDECTOMY    . CHOLECYSTECTOMY      Current Medications: Outpatient Medications Prior to Visit  Medication Sig Dispense Refill  . aspirin EC 81 MG EC tablet Take 1 tablet (81 mg total) by mouth daily.    Marland Kitchen atorvastatin (LIPITOR) 10 MG tablet Take 10 mg by mouth daily.    . metoprolol succinate (TOPROL-XL) 25 MG 24 hr tablet Take 1 tablet (25 mg total) by mouth daily. Please call an schedule an appt for additional refills 90 tablet 0  . PARoxetine (PAXIL) 40 MG tablet Take 40 mg by mouth every morning.     No facility-administered medications prior to visit.      Allergies:   Review of patient's allergies indicates no known allergies.   Social History   Social History  . Marital status: Divorced    Spouse name: N/A  . Number of children: N/A  . Years of education: N/A   Social History Main Topics  . Smoking status: Current Some Day Smoker  . Smokeless tobacco: Not on file  . Alcohol use Yes     Comment: occassionally  . Drug use: No  . Sexual activity: Yes   Other Topics Concern  .  Not on file   Social History Narrative  . No narrative on file     Family History:  The patient's family history includes Diabetes in his mother.   ROS:   Please see the history of present illness.    ROS All other systems reviewed and are negative.   PHYSICAL EXAM:   VS:  There were no vitals taken for this visit.   GEN: Well nourished, well developed, in no acute distress  HEENT: normal  Neck: no JVD, carotid bruits, or masses Cardiac: RRR; no murmurs, rubs, or gallops,no edema  Respiratory:  clear to auscultation bilaterally, normal work of breathing GI: soft, nontender, nondistended, + BS MS: no deformity or atrophy  Skin: warm and dry, no rash Neuro:  Alert and Oriented x 3, Strength and sensation are intact Psych: euthymic mood, full affect  Wt Readings from Last 3 Encounters:  06/03/14 268 lb 3.2 oz (121.7 kg)  05/02/14 267 lb (121.1 kg)      Studies/Labs Reviewed:   EKG:  EKG is ordered today.  The ekg ordered today demonstrates ***  Recent Labs: No results found for requested labs within last 8760 hours.   Lipid Panel No results found for: CHOL, TRIG, HDL, CHOLHDL, VLDL, LDLCALC, LDLDIRECT  Additional studies/ records that were reviewed today  include:  Prior office notes, lab work, EKG, echocardiogram reviewed    ASSESSMENT:    1. PAF (paroxysmal atrial fibrillation) (HCC)      PLAN:  In order of problems listed above:  Paroxysmal atrial fibrillation  - CHADS-Vasc 0-1 (borderline HTN)  - No anticoagulation based upon guidelines.  Prior history of tobacco use/excessive caffeine  - Continue to avoid tobacco and limit caffeine use.  Palpitations  - Quite rare. At prior visit Toprol was increased to a full 25 mg tablet.    Medication Adjustments/Labs and Tests Ordered: Current medicines are reviewed at length with the patient today.  Concerns regarding medicines are outlined above.  Medication changes, Labs and Tests ordered today are listed  in the Patient Instructions below. There are no Patient Instructions on file for this visit.   Signed, Donato SchultzMark Meera Vasco, MD  03/21/2016 10:23 AM    [facility] [address], [place], [place]  [address] Phone: [phone]; Fax: [phone]

## 2016-03-28 ENCOUNTER — Encounter: Payer: Self-pay | Admitting: Cardiology

## 2016-05-29 IMAGING — CR DG CHEST 2V
2 series · 2 of 2 positions shown · non-contrast
Comparison: None.

CLINICAL DATA: Sharp chest pain with difficulty breathing and
tachycardia for 2 days

EXAM:
CHEST  2 VIEW

[w chest pa]
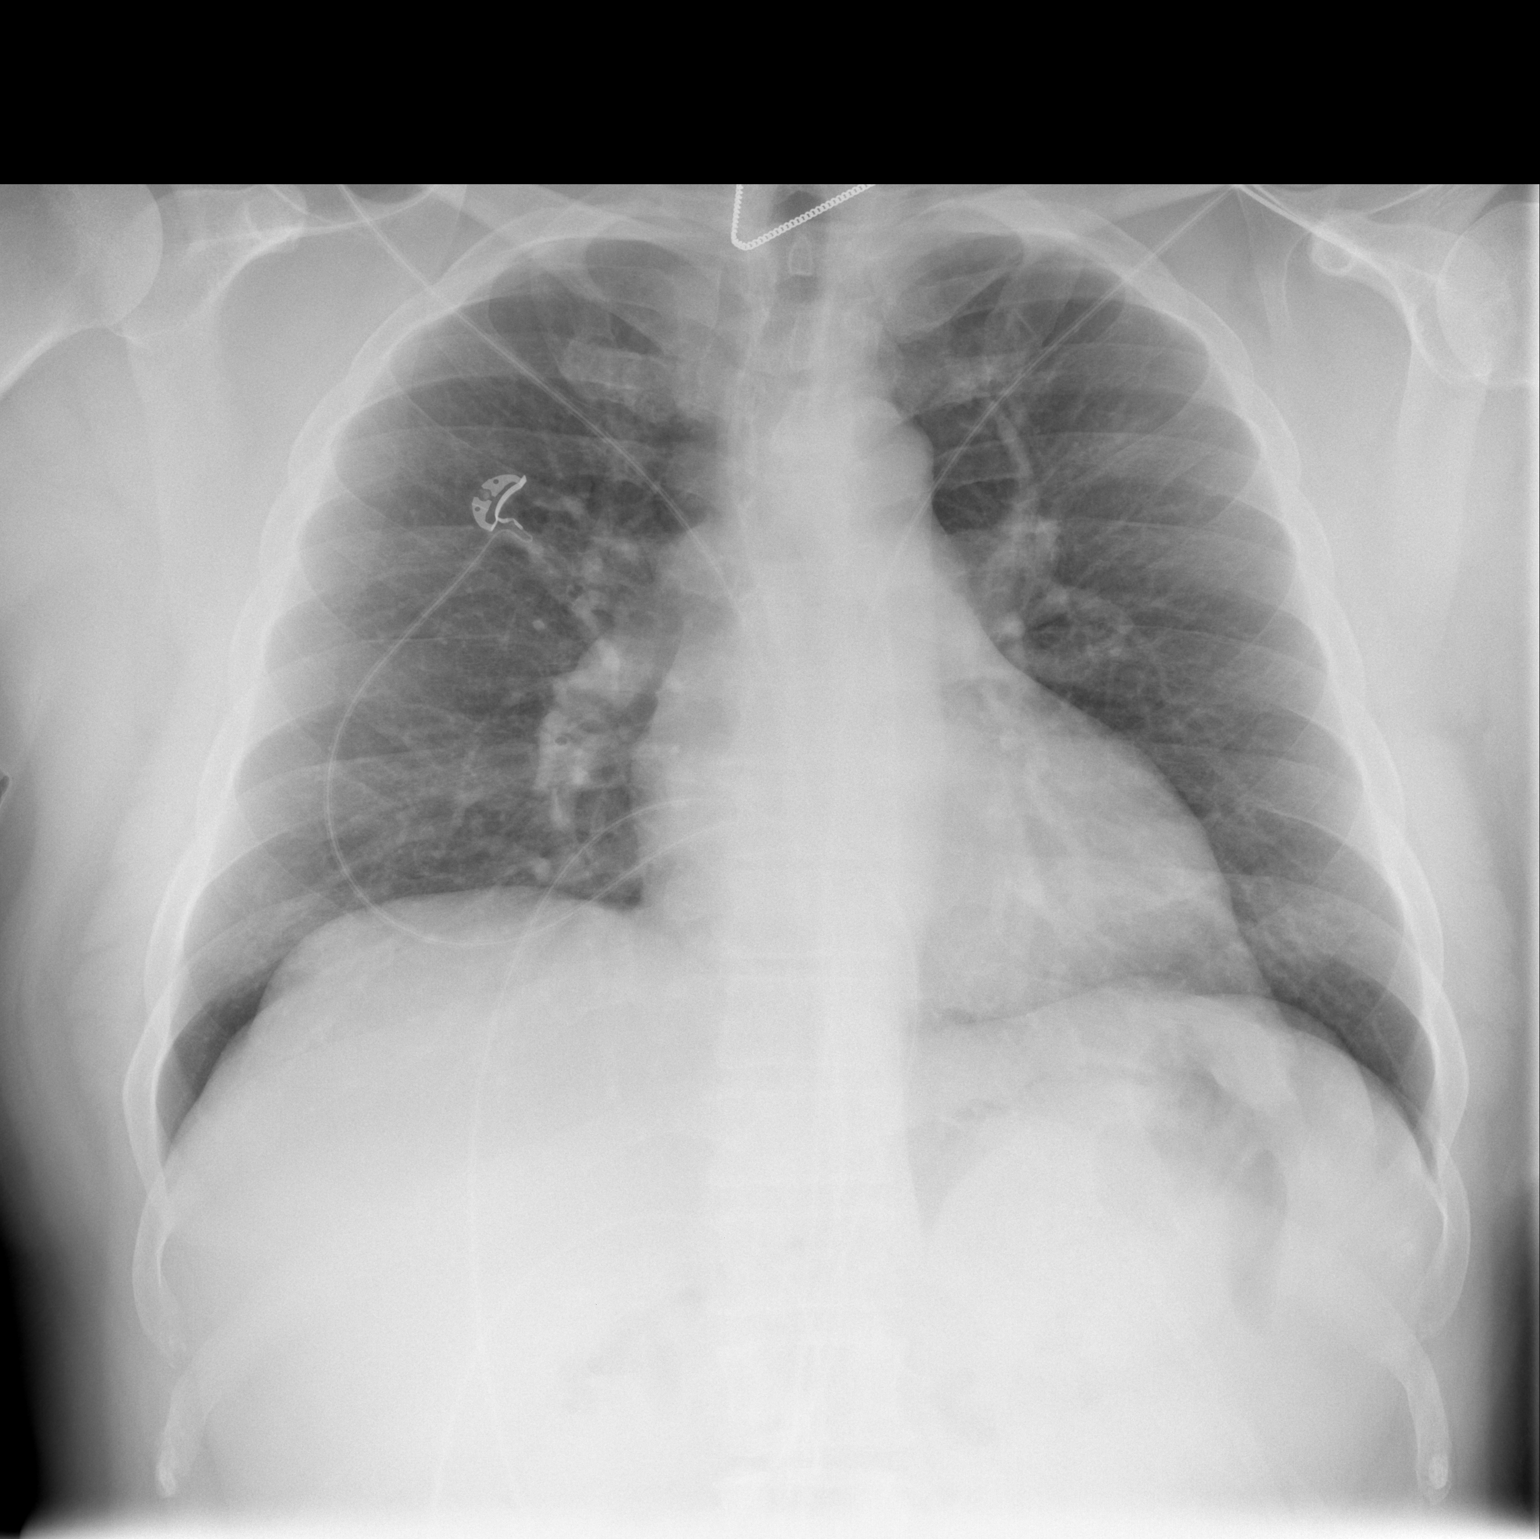

[w chest lat]
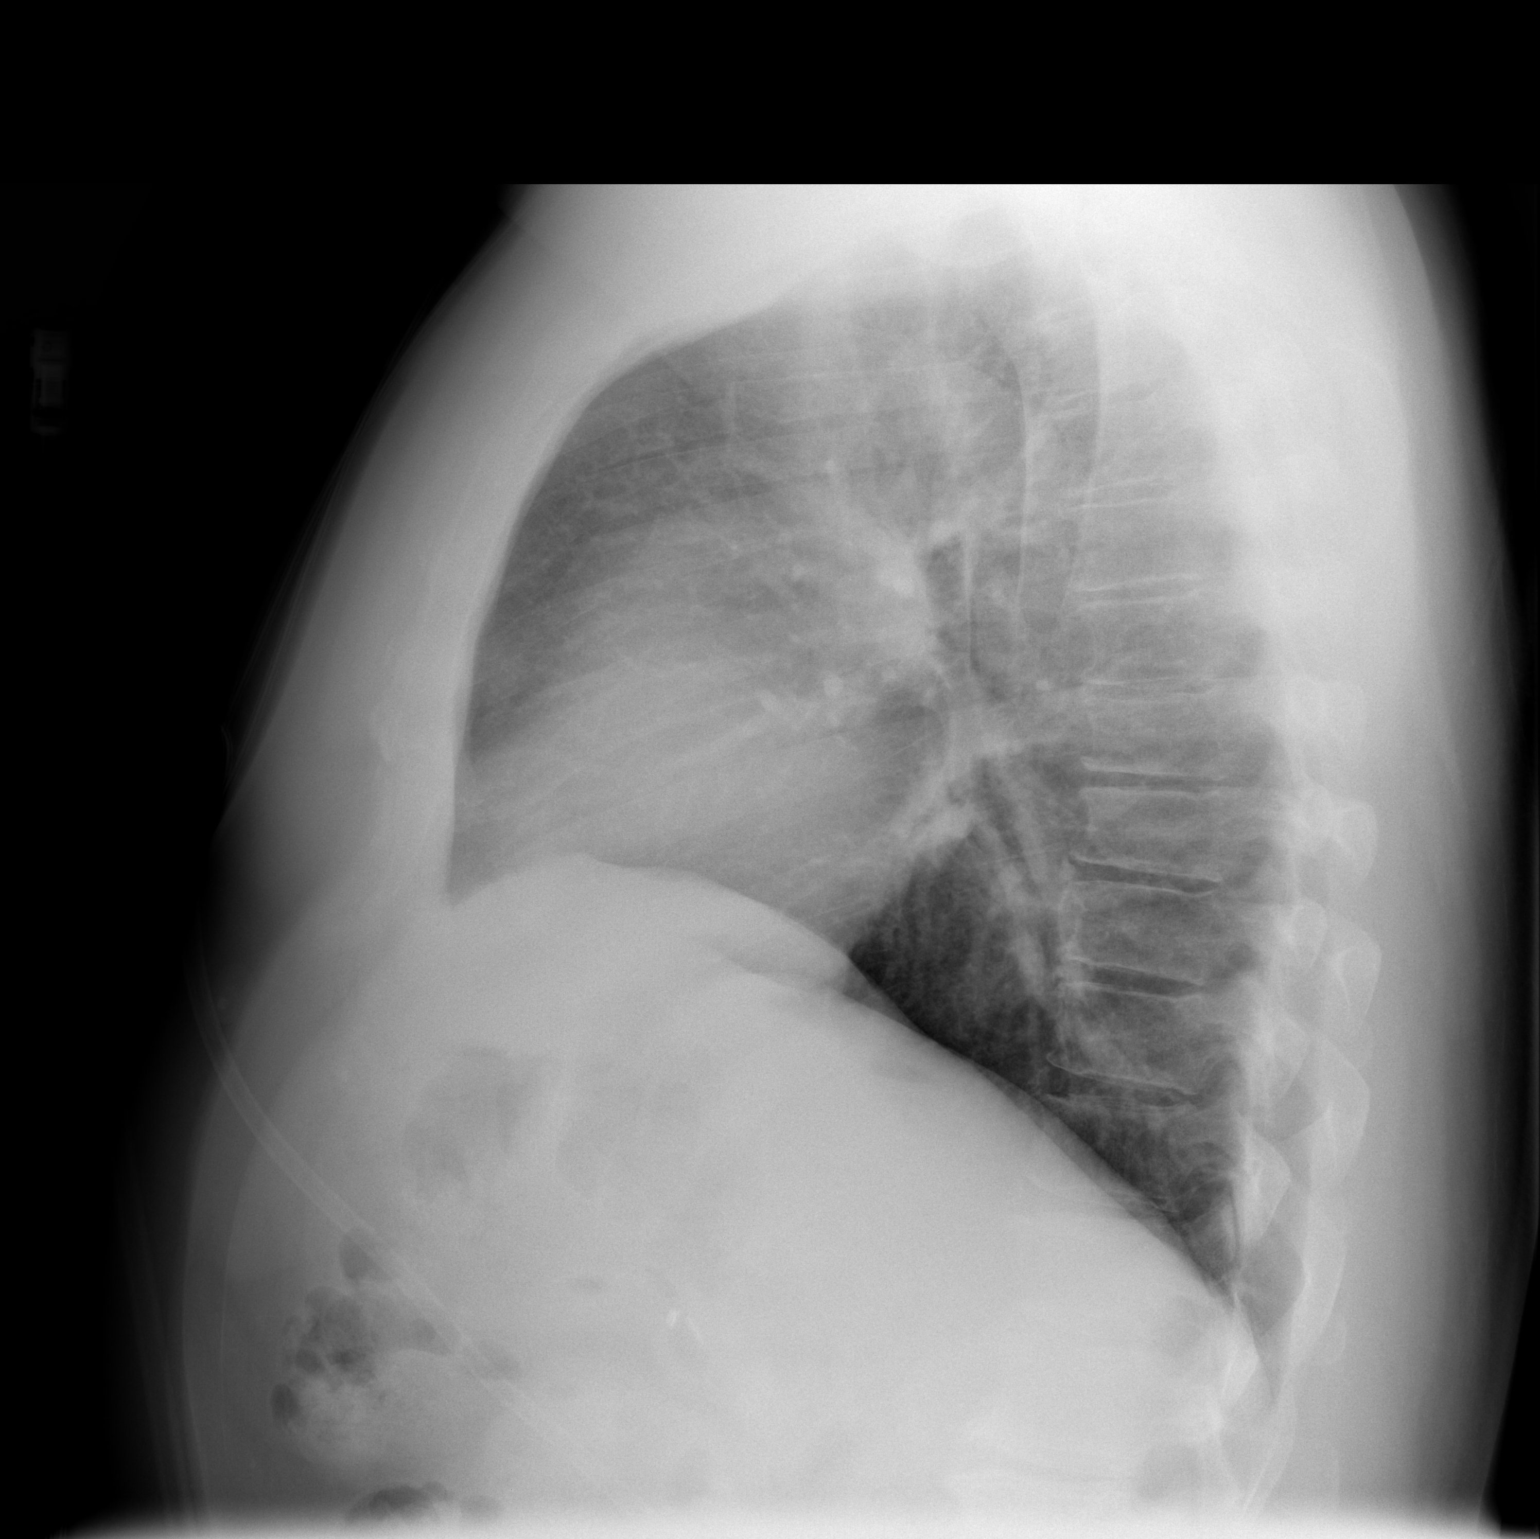

[2 of 2 positions shown; findings below may reference images not displayed]

FINDINGS: The lungs are clear. Heart size and pulmonary vascularity are
normal. No adenopathy. No pneumothorax. No bone lesions.
IMPRESSION: No edema or consolidation.

## 2017-05-21 ENCOUNTER — Encounter (HOSPITAL_COMMUNITY): Payer: Self-pay | Admitting: *Deleted

## 2017-05-21 ENCOUNTER — Emergency Department (HOSPITAL_COMMUNITY)
Admission: EM | Admit: 2017-05-21 | Discharge: 2017-05-21 | Disposition: A | Discharge status: Home / Self Care | Payer: 59 | Attending: Emergency Medicine | Admitting: Emergency Medicine

## 2017-05-21 ENCOUNTER — Emergency Department (HOSPITAL_COMMUNITY): Payer: 59

## 2017-05-21 DIAGNOSIS — R0789 Other chest pain: Secondary | ICD-10-CM | POA: Insufficient documentation

## 2017-05-21 DIAGNOSIS — R079 Chest pain, unspecified: Secondary | ICD-10-CM

## 2017-05-21 DIAGNOSIS — I4891 Unspecified atrial fibrillation: Secondary | ICD-10-CM | POA: Insufficient documentation

## 2017-05-21 DIAGNOSIS — Z7982 Long term (current) use of aspirin: Secondary | ICD-10-CM | POA: Diagnosis not present

## 2017-05-21 DIAGNOSIS — Z79899 Other long term (current) drug therapy: Secondary | ICD-10-CM | POA: Insufficient documentation

## 2017-05-21 DIAGNOSIS — F17210 Nicotine dependence, cigarettes, uncomplicated: Secondary | ICD-10-CM | POA: Insufficient documentation

## 2017-05-21 LAB — BASIC METABOLIC PANEL
Anion gap: 10 (ref 5–15)
BUN: 12 mg/dL (ref 6–20)
CHLORIDE: 102 mmol/L (ref 101–111)
CO2: 25 mmol/L (ref 22–32)
CREATININE: 0.97 mg/dL (ref 0.61–1.24)
Calcium: 9.4 mg/dL (ref 8.9–10.3)
GFR calc Af Amer: >60 mL/min (ref >60)
Glucose, Bld: 94 mg/dL (ref 65–99)
Potassium: 4.4 mmol/L (ref 3.5–5.1)
Sodium: 137 mmol/L (ref 135–145)

## 2017-05-21 LAB — CBC
HEMATOCRIT: 46.3 % (ref 39.0–52.0)
Hemoglobin: 16.2 g/dL (ref 13.0–17.0)
MCH: 31.8 pg (ref 26.0–34.0)
MCHC: 35 g/dL (ref 30.0–36.0)
MCV: 91 fL (ref 78.0–100.0)
Platelets: 251 10*3/uL (ref 150–400)
RBC: 5.09 MIL/uL (ref 4.22–5.81)
RDW: 12.9 % (ref 11.5–15.5)
WBC: 8.4 10*3/uL (ref 4.0–10.5)

## 2017-05-21 LAB — POCT I-STAT TROPONIN I
Troponin i, poc: 0 ng/mL (ref 0.00–0.08)
Troponin i, poc: 0.01 ng/mL (ref 0.00–0.08)

## 2017-05-21 NOTE — ED Provider Notes (Addendum)
[facility]   CSN: [id number] Arrival date & time: 05/21/17  [id number]     History   Chief Complaint Chief Complaint  Patient presents with  . Chest Pain    HPI Samuel Patel is a 51 y.o. male.  HPI   Diagnosed with afib 2 years ago, still feeling intermittent fluttering. Monday night started getting some "puncture pains' by heart, would go on for a few hours, made it difficult to fall asleep. Wake up in the morning it improved then it came back. Felt like a dullness. And today was feeling it some.  Feels like a "trembling", different than afib somewhat-not quite has strong.   Describes it as a stabbing pressure.  Stress makes it worse.  Not sure what makes it better, letting it run it's course.  Not exertional, comes on spontaneously. Not positional or pleuritic.  No radiation of pain.  Now feel it like a trembling, not really pain.   No shortness of breath, no nausea, no vomiting, no diaphoresis.  Sometimes will feel like heat over the body.    No leg pain or swelling, no hx of DVT/PE Occasional smoking, no other drugs, no sig etoh  No fam hx of early heart disease     Past Medical History:  Diagnosis Date  . Anxiety   . Elevated cholesterol     Patient Active Problem List   Diagnosis Date Noted  . Tobacco abuse 05/03/2014  . Anxiety 05/03/2014  . HLD (hyperlipidemia) 05/03/2014  . Obesity (BMI 35.0-39.9 without comorbidity) 05/03/2014  . Caffeine use 05/03/2014  . Atrial fibrillation with rapid ventricular response (HCC) 05/02/2014  . Atrial fibrillation (HCC) 05/02/2014    Past Surgical History:  Procedure Laterality Date  . APPENDECTOMY    . CHOLECYSTECTOMY         Home Medications    Prior to Admission medications   Medication Sig Start Date End Date Taking? Authorizing Provider  atorvastatin (LIPITOR) 10 MG tablet Take 10 mg by mouth daily.   Yes [provider]  metoprolol succinate (TOPROL-XL)  25 MG 24 hr tablet Take 1 tablet (25 mg total) by mouth daily. Please call an schedule an appt for additional refills 09/25/15  Yes Cassell ClementBrackbill, Thomas, MD  PARoxetine (PAXIL) 40 MG tablet Take 40 mg by mouth every morning.   Yes [provider]  TRUVADA 200-300 MG tablet Take 1 tablet by mouth daily. 03/13/17  Yes [provider]  aspirin EC 81 MG EC tablet Take 1 tablet (81 mg total) by mouth daily. Patient not taking: Reported on 05/21/2017 05/03/14   Dwana MelenaHager, Bryan W, PA-C    Family History Family History  Problem Relation Age of Onset  . Diabetes Mother     Social History Social History  Substance Use Topics  . Smoking status: Current Some Day Smoker  . Smokeless tobacco: Never Used  . Alcohol use Yes     Comment: occassionally     Allergies   Patient has no known allergies.   Review of Systems Review of Systems  Constitutional: Negative for fever.  HENT: Negative for sore throat.   Eyes: Negative for visual disturbance.  Respiratory: Negative for shortness of breath.   Cardiovascular: Positive for chest pain and palpitations. Negative for leg swelling.  Gastrointestinal: Negative for abdominal pain, nausea and vomiting.  Genitourinary: Negative for difficulty urinating.  Musculoskeletal: Negative for back pain and neck stiffness.  Skin: Negative for rash.  Neurological: Negative for syncope and  headaches.     Physical Exam Updated Vital Signs BP (!) 118/91 (BP Location: Left Arm)   Pulse (!) 105   Temp 98.1 F (36.7 C) (Oral)   Resp 18   SpO2 97%   Physical Exam  Constitutional: He is oriented to person, place, and time. He appears well-developed and well-nourished. No distress.  HENT:  Head: Normocephalic and atraumatic.  Eyes: Conjunctivae and EOM are normal.  Neck: Normal range of motion.  Cardiovascular: Normal rate, regular rhythm, normal heart sounds and intact distal pulses.  Exam reveals no gallop and no friction rub.   No murmur  heard. Pulmonary/Chest: Effort normal and breath sounds normal. No respiratory distress. He has no wheezes. He has no rales.  Abdominal: Soft. He exhibits no distension. There is no tenderness. There is no guarding.  Musculoskeletal: He exhibits no edema.  Neurological: He is alert and oriented to person, place, and time.  Skin: Skin is warm and dry. He is not diaphoretic.  Nursing note and vitals reviewed.    ED Treatments / Results  Labs (all labs ordered are listed, but only abnormal results are displayed) Labs Reviewed  BASIC METABOLIC PANEL  CBC  I-STAT TROPONIN, ED  POCT I-STAT TROPONIN I  I-STAT TROPONIN, ED    EKG  EKG Interpretation  Date/Time:  Wednesday May 21 2017 09:30:04 EDT Ventricular Rate:  88 PR Interval:    QRS Duration: 93 QT Interval:  340 QTC Calculation: 412 R Axis:   67 Text Interpretation:  Sinus rhythm ST elev, probable normal early repol pattern No significant change since last tracing Confirmed by Alvira Monday ([id number]) on 05/21/2017 2:42:40 PM       Radiology Dg Chest 2 View  Result Date: 05/21/2017 CLINICAL DATA:  Left side chest pain for 2 days EXAM: CHEST  2 VIEW COMPARISON:  05/02/2014 FINDINGS: Heart and mediastinal contours are within normal limits. No focal opacities or effusions. No acute bony abnormality. IMPRESSION: No active cardiopulmonary disease. Electronically Signed   By: Charlett Nose M.D.   On: 05/21/2017 10:04    Procedures Procedures (including critical care time)  Medications Ordered in ED Medications - No data to display   Initial Impression / Assessment and Plan / ED Course  I have reviewed the triage vital signs and the nursing notes.  Pertinent labs & imaging results that were available during my care of the patient were reviewed by me and considered in my medical decision making (see chart for details).     51 year old male with history of atrial fibrillation and hyperlipidemia presents with concern  for chest pain.  EKG shows benign early re-pole similar to prior EKGs.  Chest x-ray shows no sign of pneumothorax, pneumonia, and shows a normal mediastinum.  He has a history, physical exam not consistent with dissection, or pulmonary embolus.  Initial troponin negative.  Encourage patient to do second troponin at 3 hours, however he reports he has to leave to pick up his child. Second troponin ordered early which is negative. He is low risk heart score.  He reports the pain worsens with stress, but is not exertional.  Recommend follow-up with cardiology and primary care physician.  Patient discharged in stable condition with understanding of reasons to return.   Final Clinical Impressions(s) / ED Diagnoses   Final diagnoses:  Chest pain, unspecified type    New Prescriptions New Prescriptions   No medications on file         Alvira Monday, MD 05/21/17 1447

## 2017-05-21 NOTE — ED Triage Notes (Signed)
Pt complains of left sided chest pain for the past 2 days. Pt has hx of atrial fibrillation.

## 2019-11-01 ENCOUNTER — Ambulatory Visit: Payer: 59 | Attending: Internal Medicine

## 2019-11-01 DIAGNOSIS — Z23 Encounter for immunization: Secondary | ICD-10-CM

## 2019-11-01 NOTE — Progress Notes (Signed)
   Covid-19 Vaccination Clinic  Name:  Samuel Patel    MRN: [id number] DOB: 1965/09/06  11/01/2019  [name] was observed post Covid-19 immunization for 15 minutes without incident. He was provided with Vaccine Information Sheet and instruction to access the V-Safe system.   [name] was instructed to call 911 with any severe reactions post vaccine: Marland Kitchen Difficulty breathing  . Swelling of face and throat  . A fast heartbeat  . A bad rash all over body  . Dizziness and weakness   Immunizations Administered    Name Date Dose VIS Date Route   Pfizer COVID-19 Vaccine 11/01/2019 12:08 PM 0.3 mL 07/02/2019 Intramuscular   Manufacturer: [employer], [employer]   Lot: [id number]   NDC: [address]-9

## 2019-11-22 ENCOUNTER — Ambulatory Visit: Payer: 59 | Attending: Internal Medicine

## 2019-11-22 DIAGNOSIS — Z23 Encounter for immunization: Secondary | ICD-10-CM

## 2019-11-22 NOTE — Progress Notes (Signed)
   Covid-19 Vaccination Clinic  Name:  Samuel Patel    MRN: [id number] DOB: 1966/04/03  11/22/2019  [name] was observed post Covid-19 immunization for 15 minutes without incident. He was provided with Vaccine Information Sheet and instruction to access the V-Safe system.   [name] was instructed to call 911 with any severe reactions post vaccine: Marland Kitchen Difficulty breathing  . Swelling of face and throat  . A fast heartbeat  . A bad rash all over body  . Dizziness and weakness   Immunizations Administered    Name Date Dose VIS Date Route   Pfizer COVID-19 Vaccine 11/22/2019  2:05 PM 0.3 mL 09/15/2018 Intramuscular   Manufacturer: [employer], [employer]   Lot: [id number]   NDC: [address]-5

## 2020-07-07 ENCOUNTER — Other Ambulatory Visit: Payer: Self-pay | Admitting: Physician Assistant

## 2020-07-07 DIAGNOSIS — M545 Low back pain, unspecified: Secondary | ICD-10-CM

## 2020-07-30 ENCOUNTER — Other Ambulatory Visit: Payer: Self-pay

## 2020-07-30 ENCOUNTER — Ambulatory Visit
Admission: RE | Admit: 2020-07-30 | Discharge: 2020-07-30 | Disposition: A | Discharge status: Home / Self Care | Payer: 59 | Source: Ambulatory Visit | Attending: Physician Assistant | Admitting: Physician Assistant

## 2020-07-30 DIAGNOSIS — M545 Low back pain, unspecified: Secondary | ICD-10-CM

## 2021-06-18 ENCOUNTER — Encounter: Payer: Self-pay | Admitting: Cardiovascular Disease

## 2021-06-18 ENCOUNTER — Other Ambulatory Visit: Payer: Self-pay

## 2021-06-18 ENCOUNTER — Ambulatory Visit: Payer: 59 | Admitting: Cardiovascular Disease

## 2021-06-18 DIAGNOSIS — Z720 Tobacco use: Secondary | ICD-10-CM

## 2021-06-18 DIAGNOSIS — E668 Other obesity: Secondary | ICD-10-CM

## 2021-06-18 DIAGNOSIS — E669 Obesity, unspecified: Secondary | ICD-10-CM

## 2021-06-18 DIAGNOSIS — I480 Paroxysmal atrial fibrillation: Secondary | ICD-10-CM

## 2021-06-18 DIAGNOSIS — G4733 Obstructive sleep apnea (adult) (pediatric): Secondary | ICD-10-CM | POA: Diagnosis not present

## 2021-06-18 DIAGNOSIS — R0683 Snoring: Secondary | ICD-10-CM | POA: Diagnosis not present

## 2021-06-18 DIAGNOSIS — R002 Palpitations: Secondary | ICD-10-CM

## 2021-06-18 DIAGNOSIS — G609 Hereditary and idiopathic neuropathy, unspecified: Secondary | ICD-10-CM

## 2021-06-18 DIAGNOSIS — E785 Hyperlipidemia, unspecified: Secondary | ICD-10-CM | POA: Diagnosis not present

## 2021-06-18 NOTE — Patient Instructions (Signed)
  Testing/Procedures:  Your physician has recommended that you have a sleep study. This test records several body functions during sleep, including: brain activity, eye movement, oxygen and carbon dioxide blood levels, heart rate and rhythm, breathing rate and rhythm, the flow of air through your mouth and nose, snoring, body muscle movements, and chest and belly movement. [facility]   Follow-Up: At [facility], you and your health needs are our priority.  As part of our continuing mission to provide you with exceptional heart care, we have created designated Provider Care Teams.  These Care Teams include your primary Cardiologist (physician) and Advanced Practice Providers (APPs -  Physician Assistants and Nurse Practitioners) who all work together to provide you with the care you need, when you need it.  We recommend signing up for the patient portal called "MyChart".  Sign up information is provided on this After Visit Summary.  MyChart is used to connect with patients for Virtual Visits (Telemedicine).  Patients are able to view lab/test results, encounter notes, upcoming appointments, etc.  Non-urgent messages can be sent to your provider as well.   To learn more about what you can do with MyChart, go to [email or web address].    Your next appointment:   4 month(s)  The format for your next appointment:   In Person  Provider:   Nicki Guadalajara MD

## 2021-06-18 NOTE — Progress Notes (Signed)
Cardiology Office Note    Date:  06/25/2021   ID:  Samuel Patel, DOB 07/31/1965, MRN [id number]  PCP:  Wilson SingerJariwala, Arvind N, MD  Cardiologist:  Nicki Guadalajarahomas Braxon Suder, MD   New sleep evaluation referred through the courtesy of Dr. Ailene ArdsArvind Jarlwala at [facility] for evaluation of possible sleep apnea.   History of Present Illness:  Samuel Patel is a [age] y.o. male who was born in [place] ultimately moved to [place] where he lived for 12 years.  He moved to [place] approximately 10 years ago but continues to see Dr. Ailene RavelJariwala for his primary care.  Samuel Patel was seen in 2015 by Dr. Ronny Flurryom Brackbill after experiencing an episode of paroxysmal atrial fibrillation.  He converted to sinus rhythm with a diltiazem drip.  At the time his [place] score was 0 and he was not started on anticoagulation.  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  He states that he is not sleeping well.  Upon questioning he snores loudly, has awaken gasping for breath, he has nocturia 2-3 times per night, and his sleep is nonrestorative.  He denies significant daytime sleepiness.  It is very difficult for him to sleep on his back.  He typically goes to bed around midnight and wakes up at 5:30 AM.  Epworth Sleepiness Scale score was calculated in the office today and this endorsed at 8.  He is now referred by his primary physician for assessment for's sleep apnea.   Past Medical History:  Diagnosis Date   Anxiety    Elevated cholesterol     Past Surgical History:  Procedure Laterality Date   APPENDECTOMY     CHOLECYSTECTOMY      Current Medications: Outpatient Medications Prior to Visit  Medication Sig Dispense Refill   amLODipine (NORVASC) 5 MG tablet Take 5 mg by mouth daily.     atorvastatin (LIPITOR) 10 MG tablet Take 10 mg by mouth daily.     celecoxib (CELEBREX) 200 MG capsule Take 200 mg by mouth  daily.     gabapentin (NEURONTIN) 300 MG capsule Take 300 mg by mouth daily.     olmesartan (BENICAR) 40 MG tablet Take 40 mg by mouth daily.     omeprazole (PRILOSEC) 10 MG capsule Take 10 mg by mouth daily.     OZEMPIC, 1 MG/DOSE, 4 MG/3ML SOPN Inject 1 mg into the skin once a week.     PARoxetine (PAXIL) 40 MG tablet Take 40 mg by mouth every morning.     aspirin EC 81 MG EC tablet Take 1 tablet (81 mg total) by mouth daily.     metoprolol succinate (TOPROL-XL) 25 MG 24 hr tablet Take 1 tablet (25 mg total) by mouth daily. Please call an schedule an appt for additional refills (Patient not taking: Reported on 06/18/2021) 90 tablet 0   TRUVADA 200-300 MG tablet Take 1 tablet by mouth daily. (Patient not taking: Reported on 06/18/2021)     No facility-administered medications prior to visit.     Allergies:   Patient has no known allergies.   Social History   Socioeconomic History   Marital status: Divorced    Spouse name: Not on file   Number of children: Not on file   Years of education: Not on file   Highest education level: Not on file  Occupational History   Not on file  Tobacco Use  Smoking status: Every Day    Types: Cigarettes   Smokeless tobacco: Never  Substance and Sexual Activity   Alcohol use: Yes    Comment: occassionally   Drug use: No   Sexual activity: Yes  Other Topics Concern   Not on file  Social History Narrative   Not on file   Social Determinants of Health   Financial Resource Strain: Not on file  Food Insecurity: Not on file  Transportation Needs: Not on file  Physical Activity: Not on file  Stress: Not on file  Social Connections: Not on file     Socially he is divorced and has a daughter.  Remotely had been on Truvada which he is no longer taking.  His parents are from [place]  He has a tobacco history.  Currently smokes a quarter of a pack per day and has been smoking for 30 years.  He works in Airline pilot for [employer].  He has not been  exercising due to "laziness."  Family History:  The patient's family history includes Diabetes in his mother.   His mother is 27 years old and has a history of hypertension, type 2 diabetes mellitus and hyperlipidemia.  His father is 65 years old and has a history of esophageal cancer, hyperlipidemia and diabetes.  Has 2 brothers and 1 sister.  ROS General: Negative; No fevers, chills, or night sweats; obesity HEENT: Negative; No changes in vision or hearing, sinus congestion, difficulty swallowing Pulmonary: Negative; No cough, wheezing, shortness of breath, hemoptysis Cardiovascular: Negative; No chest pain, presyncope, syncope, palpitations GI: History of polyps GU: Negative; No dysuria, hematuria, or difficulty voiding Musculoskeletal: Cervical and lumbar degenerative disc disease Hematologic/Oncology: Negative; no easy bruising, bleeding Endocrine: Negative; no heat/cold intolerance; no diabetes Neuro: Negative; no changes in balance, headaches Skin: Negative; No rashes or skin lesions Psychiatric: Negative; No behavioral problems, depression Sleep: Positive for snoring, frequent awakenings, nonrestorative sleep, awakening gasping for breath, nocturia 2-3 times per night; no daytime sleepiness, hypersomnolence, bruxism, restless legs, hypnogognic hallucinations, no cataplexy   Epworth Sleepiness Scale: Situation   Chance of Dozing/Sleeping (0 = never , 1 = slight chance , 2 = moderate chance , 3 = high chance )   sitting and reading 3   watching TV 1   sitting inactive in a public place 2   being a passenger in a motor vehicle for an hour or more 1   lying down in the afternoon 0   sitting and talking to someone 0   sitting quietly after lunch (no alcohol) 1   while stopped for a few minutes in traffic as the driver 0   Total Score  8    Other comprehensive 14 point system review is negative.   PHYSICAL EXAM:   VS:  BP 120/86 (BP Location: Right Arm)   Pulse 79   Ht 6'  (1.829 m)   Wt 275 lb 9.6 oz (125 kg)   SpO2 96%   BMI 37.38 kg/m     Repeat blood pressure by me was 120/78  Wt Readings from Last 3 Encounters:  06/18/21 275 lb 9.6 oz (125 kg)  06/03/14 268 lb 3.2 oz (121.7 kg)  05/02/14 267 lb (121.1 kg)    General: Alert, oriented, no distress.  Skin: normal turgor, no rashes, warm and dry HEENT: Normocephalic, atraumatic. Pupils equal round and reactive to light; sclera anicteric; extraocular muscles intact; Fundi ** Nose without nasal septal hypertrophy Mouth/Parynx: Elongated uvula; Mallinpatti scale 3 Neck: No  JVD, no carotid bruits; normal carotid upstroke Lungs: clear to ausculatation and percussion; no wheezing or rales Chest wall: without tenderness to palpitation Heart: PMI not displaced, RRR, s1 s2 normal, 1/6 systolic murmur, no diastolic murmur, no rubs, gallops, thrills, or heaves Abdomen: soft, nontender; no hepatosplenomehaly, BS+; abdominal aorta nontender and not dilated by palpation. Back: no CVA tenderness Pulses 2+ Musculoskeletal: full range of motion, normal strength, no joint deformities Extremities: no clubbing cyanosis or edema, Homan's sign negative  Neurologic: grossly nonfocal; Cranial nerves grossly wnl Psychologic: Normal mood and affect   Studies/Labs Reviewed:   EKG:  EKG is ordered today.  ECG (independently read by me):  NSR at 79, no ectopy, , normal intervals  Recent Labs: BMP Latest Ref Rng & Units 05/21/2017 05/03/2014 05/02/2014  Glucose 65 - 99 mg/dL 94 108(H) 137(H)  BUN 6 - 20 mg/dL 12 13 19   Creatinine 0.61 - 1.24 mg/dL 0.97 0.89 1.00  Sodium 135 - 145 mmol/L 137 137 140  Potassium 3.5 - 5.1 mmol/L 4.4 3.9 4.1  Chloride 101 - 111 mmol/L 102 102 101  CO2 22 - 32 mmol/L 25 23 23   Calcium 8.9 - 10.3 mg/dL 9.4 8.6 9.5     No flowsheet data found.  CBC Latest Ref Rng & Units 05/21/2017 05/03/2014 05/02/2014  WBC 4.0 - 10.5 K/uL 8.4 9.1 9.5  Hemoglobin 13.0 - 17.0 g/dL 16.2 14.4 16.5   Hematocrit 39.0 - 52.0 % 46.3 40.7 46.4  Platelets 150 - 400 K/uL 251 217 272   Lab Results  Component Value Date   MCV 91.0 05/21/2017   MCV 88.3 05/03/2014   MCV 86.9 05/02/2014   Lab Results  Component Value Date   TSH 1.550 05/02/2014   Lab Results  Component Value Date   HGBA1C 5.7 (H) 05/02/2014     BNP No results found for: BNP  ProBNP    Component Value Date/Time   PROBNP 656.8 (H) 05/02/2014 1255     Lipid Panel  No results found for: CHOL, TRIG, HDL, CHOLHDL, VLDL, LDLCALC, LDLDIRECT, LABVLDL   RADIOLOGY: No results found.   Additional studies/ records that were reviewed today include:  I reviewed the records from [facility] at [facility] in [place].  2015 records from Dr. Darlin Coco were reviewed  ASSESSMENT:    1. Evaluate for OSA (obstructive sleep apnea)   2. Snoring   3. Paroxysmal atrial fibrillation (HCC)   4. Hyperlipidemia, unspecified hyperlipidemia type   5. Moderate obesity   6. Palpitations   7. Tobacco abuse   8. Idiopathic peripheral neuropathy     PLAN:  Samuel Patel is a very pleasant 55 year old Hispanic male family history originating in [place].  He was born in [place].  In 2015, he had an episode of paroxysmal atrial fibrillation and he was pharmacologically cardioverted with IV diltiazem.  An echo Doppler study at that time showed an EF of 55 to 60%.  At the time he was started on Toprol-XL.  Although he moved to [place] 10 years ago from [place], he still goes to [facility] for his primary care.  Recently, he was placed on Ozempic for obesity and he has a history of hypertension and is on amlodipine 5 mg, metoprolol succinate 25 mg, and olmesartan 40 mg daily.  He also has a history of anxiety for which he takes paroxetine 40 mg.  He takes gabapentin 300 mg 4 peripheral neuropathy with possible restless legs.  He has been  on low-dose atorvastatin for hyperlipidemia.  He has a  longstanding history of not sleeping well.  He admits to loud snoring, and awakens gasping for breath.  He cannot sleep on his back.  His sleep has been nonrestorative and he experiences nocturia approximately 2-3 times per night.  I had a long discussion with him today in the office and discussed normal sleep architecture and potential adverse effects of untreated sleep apnea on a normal sleep pattern.  We discussed potential cardiovascular ramifications particularly including blood pressure, nocturnal palpitations, increase incidence of atrial fibrillation, as well as potential for nocturnal hypoxemia contributing to both cardiac as well as cerebrovascular ischemia in the setting of coronary or cerebrovascular disease.  I discussed its implications with reference to glucose, GERD, as well as inflammation.  He cannot sleep on his back which suspect is due to increased severity of sleep apnea typically with supine posture.  He has a elongated uvula which undoubtedly may be contributing to some of his clinical events.  I discussed the pathophysiology associated with increased nocturia secondary to obstructive sleep apnea.  He is moderately obese with a BMI of 37.38 and recently was started on Ozempic for obesity and not for diabetes.  He has noted periodic heart rate irregularity.  I will try to schedule him for an in lab split-night protocol which hopefully will be approved but if not approved by his insurance home sleep test will be done.  Blood pressure today is controlled on his current regimen of olmesartan 40 mg, Toprol-XL 25 mg and amlodipine 5 mg.  I will try to obtain results of laboratory.  I will see him in follow-up of his sleep study and anticipate he will be positive for sleep apnea for compliance following CPAP initiation.   Medication Adjustments/Labs and Tests Ordered: Current medicines are reviewed at length with the patient today.  Concerns regarding medicines are outlined above.  Medication  changes, Labs and Tests ordered today are listed in the Patient Instructions below. Patient Instructions   Testing/Procedures:  Your physician has recommended that you have a sleep study. This test records several body functions during sleep, including: brain activity, eye movement, oxygen and carbon dioxide blood levels, heart rate and rhythm, breathing rate and rhythm, the flow of air through your mouth and nose, snoring, body muscle movements, and chest and belly movement. [place]   Follow-Up: At [facility], you and your health needs are our priority.  As part of our continuing mission to provide you with exceptional heart care, we have created designated Provider Care Teams.  These Care Teams include your primary Cardiologist (physician) and Advanced Practice Providers (APPs -  Physician Assistants and Nurse Practitioners) who all work together to provide you with the care you need, when you need it.  We recommend signing up for the patient portal called "MyChart".  Sign up information is provided on this After Visit Summary.  MyChart is used to connect with patients for Virtual Visits (Telemedicine).  Patients are able to view lab/test results, encounter notes, upcoming appointments, etc.  Non-urgent messages can be sent to your provider as well.   To learn more about what you can do with MyChart, go to [email or web address].    Your next appointment:   4 month(s)  The format for your next appointment:   In Person  Provider:   Shelva Majestic MD    Signed, Shelva Majestic, MD  06/25/2021 5:51 PM    [facility] [address]  [address] [place], [place], [place]  [address] Phone: [phone]

## 2021-06-19 ENCOUNTER — Other Ambulatory Visit: Payer: Self-pay | Admitting: Cardiovascular Disease

## 2021-06-19 DIAGNOSIS — I4891 Unspecified atrial fibrillation: Secondary | ICD-10-CM

## 2021-06-19 DIAGNOSIS — R0683 Snoring: Secondary | ICD-10-CM

## 2021-06-25 ENCOUNTER — Encounter: Payer: Self-pay | Admitting: Cardiovascular Disease

## 2021-06-26 ENCOUNTER — Encounter: Payer: Self-pay | Admitting: Cardiovascular Disease

## 2021-06-26 ENCOUNTER — Telehealth: Payer: Self-pay | Admitting: *Deleted

## 2021-06-26 NOTE — Telephone Encounter (Signed)
Prior Authorization for split night sleep study sent to [facility] via web portal. Tracking Number [id number].

## 2021-06-26 NOTE — Telephone Encounter (Signed)
-----   Message from Debra W Mathis, RN sent at 06/18/2021 11:08 AM EST ----- Sleep study ordered today  

## 2021-07-25 ENCOUNTER — Other Ambulatory Visit: Payer: Self-pay | Admitting: Cardiovascular Disease

## 2021-07-25 DIAGNOSIS — E669 Obesity, unspecified: Secondary | ICD-10-CM

## 2021-07-25 DIAGNOSIS — G478 Other sleep disorders: Secondary | ICD-10-CM

## 2021-07-25 DIAGNOSIS — I4891 Unspecified atrial fibrillation: Secondary | ICD-10-CM

## 2021-07-25 DIAGNOSIS — R0683 Snoring: Secondary | ICD-10-CM

## 2021-07-25 NOTE — Telephone Encounter (Signed)
Denial received for in lab sleep study. Medical criteria not met. HST will be ordered. No PA is required.

## 2021-07-26 ENCOUNTER — Encounter (HOSPITAL_BASED_OUTPATIENT_CLINIC_OR_DEPARTMENT_OTHER): Payer: Self-pay | Admitting: Cardiovascular Disease

## 2021-07-30 ENCOUNTER — Telehealth: Payer: Self-pay | Admitting: *Deleted

## 2021-07-30 NOTE — Telephone Encounter (Signed)
Attempted to contact patient with HST appointment details. Will send mychart message.

## 2021-08-15 ENCOUNTER — Ambulatory Visit (HOSPITAL_BASED_OUTPATIENT_CLINIC_OR_DEPARTMENT_OTHER): Payer: 59 | Attending: Cardiovascular Disease | Admitting: Cardiovascular Disease

## 2021-09-21 ENCOUNTER — Ambulatory Visit (HOSPITAL_BASED_OUTPATIENT_CLINIC_OR_DEPARTMENT_OTHER): Payer: 59 | Attending: Cardiovascular Disease | Admitting: Cardiovascular Disease

## 2021-09-21 ENCOUNTER — Other Ambulatory Visit: Payer: Self-pay

## 2021-09-21 DIAGNOSIS — G478 Other sleep disorders: Secondary | ICD-10-CM | POA: Insufficient documentation

## 2021-09-21 DIAGNOSIS — E669 Obesity, unspecified: Secondary | ICD-10-CM | POA: Diagnosis not present

## 2021-09-21 DIAGNOSIS — I4891 Unspecified atrial fibrillation: Secondary | ICD-10-CM | POA: Diagnosis not present

## 2021-09-21 DIAGNOSIS — G4733 Obstructive sleep apnea (adult) (pediatric): Secondary | ICD-10-CM

## 2021-09-21 DIAGNOSIS — R0683 Snoring: Secondary | ICD-10-CM | POA: Insufficient documentation

## 2021-09-28 ENCOUNTER — Encounter (HOSPITAL_BASED_OUTPATIENT_CLINIC_OR_DEPARTMENT_OTHER): Payer: Self-pay | Admitting: Cardiovascular Disease

## 2021-09-28 NOTE — Procedures (Signed)
? ? ? ?  Patient Name: Samuel Patel, Samuel Patel ?Study Date: 09/22/2021 ?Gender: Male ?D.O.B: [birth year] ?Age (years): 48 ?Referring Provider: Nicki Guadalajara MD, ABSM ?Height (inches): 71 ?Interpreting Physician: Nicki Guadalajara MD, ABSM ?Weight (lbs): 275 ?RPSGT: Shandon Sink ?BMI: 38 ?MRN: [id number] ?Neck Size: 17.50 ? ?CLINICAL INFORMATION ?Sleep Study Type: HST ? ?Indication for sleep study: snoring, awakening gasping for breath, nocturia, inability to sleep on back, non-restorative sleep ? ?Epworth Sleepiness Score: 7 ? ?SLEEP STUDY TECHNIQUE ?A multi-channel overnight portable sleep study was performed. The channels recorded were: nasal airflow, thoracic respiratory movement, and oxygen saturation with a pulse oximetry. Snoring was also monitored. ? ?MEDICATIONS ?amLODipine (NORVASC) 5 MG tablet ?aspirin EC 81 MG EC tablet ?atorvastatin (LIPITOR) 10 MG tablet ?celecoxib (CELEBREX) 200 MG capsule ?gabapentin (NEURONTIN) 300 MG capsule ?metoprolol succinate (TOPROL-XL) 25 MG 24 hr tablet ?olmesartan (BENICAR) 40 MG tablet ?omeprazole (PRILOSEC) 10 MG capsule ?OZEMPIC, 1 MG/DOSE, 4 MG/3ML SOPN ?PARoxetine (PAXIL) 40 MG tablet  ?Patient self administered medications include: N/A. ? ?SLEEP ARCHITECTURE ?Patient was studied for 375 minutes. The sleep efficiency was 100.0 % and the patient was supine for 99.5%. The arousal index was 0.0 per hour. ? ?RESPIRATORY PARAMETERS ?The overall AHI was 90.9 per hour, with a central apnea index of 0 per hour. ? ?The oxygen nadir was 65% during sleep. ? ?CARDIAC DATA ?Mean heart rate during sleep was 77.6 bpm. ? ?IMPRESSIONS ?- Very severe obstructive sleep apnea occurred during this study (AHI 90.9/h). ?- Severe oxygen desaturation to a nadir of 65%. Time spent < 89% was 182 minutes. ?- Patient snored for 95.3 minutes (25.4%) during the sleep. ? ?DIAGNOSIS ?- Obstructive Sleep Apnea (G47.33) ?- Nocturnal Hypoxemia (G47.36) ? ?RECOMMENDATIONS ?- In this patient with very severe sleep  apnea recommend an expeditious in-lab CPAP/BiPAP titration study. If unable to obtain an in- lab evaluation, initiate Auto-PAP with EPR of 3 at 8 - 20 cm of water. ?- Effort should be made to optimize nasal and oropharyngeal patency. ?- Positional therapy avoiding supine position during sleep. ?- Avoid alcohol, sedatives and other CNS depressants that may worsen sleep apnea and disrupt normal sleep architecture. ?- Sleep hygiene should be reviewed to assess factors that may improve sleep quality. ?- Weight management (BMI 38) and regular exercise should be initiated or continued. ?- Return to Sleep Center to discuss the results of this study ? ? ?[Electronically signed] 09/28/2021 10:31 AM ? ?Nicki Guadalajara MD, [facility], ABSM ?Diplomate, American Board of Sleep Medicine ? ? ?NPI: [id number] ?[facility] ?PH: [phone]   FX: [phone] ?ACCREDITED BY THE AMERICAN ACADEMY OF SLEEP MEDICINE ? ?

## 2021-10-03 ENCOUNTER — Other Ambulatory Visit: Payer: Self-pay | Admitting: Cardiovascular Disease

## 2021-10-03 ENCOUNTER — Telehealth: Payer: Self-pay | Admitting: *Deleted

## 2021-10-03 DIAGNOSIS — G4736 Sleep related hypoventilation in conditions classified elsewhere: Secondary | ICD-10-CM

## 2021-10-03 DIAGNOSIS — I480 Paroxysmal atrial fibrillation: Secondary | ICD-10-CM

## 2021-10-03 DIAGNOSIS — G4733 Obstructive sleep apnea (adult) (pediatric): Secondary | ICD-10-CM

## 2021-10-03 NOTE — Telephone Encounter (Signed)
-----   Message from Lennette Bihari, MD sent at 09/28/2021 10:41 AM EST ----- ?Samuel Patel, please notify pt and send report to referring Dr. Wilson Singer; schedule for in-lab CPAP/possibe BiPAP titration ?

## 2021-10-03 NOTE — Telephone Encounter (Signed)
Patient notified of HST results and recommendations. He agrees to proceed with titration study pending [facility] approval. ?

## 2021-10-08 ENCOUNTER — Ambulatory Visit: Payer: 59 | Admitting: Cardiovascular Disease

## 2021-10-08 ENCOUNTER — Other Ambulatory Visit: Payer: Self-pay

## 2021-10-08 ENCOUNTER — Encounter: Payer: Self-pay | Admitting: Cardiovascular Disease

## 2021-10-08 DIAGNOSIS — G478 Other sleep disorders: Secondary | ICD-10-CM | POA: Diagnosis not present

## 2021-10-08 DIAGNOSIS — G4736 Sleep related hypoventilation in conditions classified elsewhere: Secondary | ICD-10-CM | POA: Diagnosis not present

## 2021-10-08 DIAGNOSIS — G4733 Obstructive sleep apnea (adult) (pediatric): Secondary | ICD-10-CM | POA: Diagnosis not present

## 2021-10-08 DIAGNOSIS — Z6839 Body mass index (BMI) 39.0-39.9, adult: Secondary | ICD-10-CM

## 2021-10-08 DIAGNOSIS — R0683 Snoring: Secondary | ICD-10-CM | POA: Diagnosis not present

## 2021-10-08 DIAGNOSIS — I10 Essential (primary) hypertension: Secondary | ICD-10-CM

## 2021-10-08 NOTE — Patient Instructions (Signed)
Medication Instructions:  ?The current medical regimen is effective;  continue present plan and medications as directed. Please refer to the Current Medication list given to you today. ? ?*If you need a refill on your cardiac medications before your next appointment, please call your pharmacy* ? ?Lab Work:   Testing/Procedures:  ?NONE    NONE ? ?Special Instructions ?Your physician has recommended that you have a sleep study. This test records several body functions during sleep, including: brain activity, eye movement, oxygen and carbon dioxide blood levels, heart rate and rhythm, breathing rate and rhythm, the flow of air through your mouth and nose, snoring, body muscle movements, and chest and belly movement. ? ?Follow-Up: ?Your next appointment:  2 WEEKS AFTER SLEEP STUDY  In Person with DR Tresa Endo  ? ?At [facility], you and your health needs are our priority.  As part of our continuing mission to provide you with exceptional heart care, we have created designated Provider Care Teams.  These Care Teams include your primary Cardiologist (physician) and Advanced Practice Providers (APPs -  Physician Assistants and Nurse Practitioners) who all work together to provide you with the care you need, when you need it. ? ? ?

## 2021-10-08 NOTE — Progress Notes (Signed)
? ?Cardiology Office Note   ? ?Date:  10/13/2021  ? ?ID:  Samuel Patel, DOB 03/22/1966, MRN [id number] ? ?PCP:  Wilson SingerJariwala, Arvind N, MD  ?Cardiologist:  Nicki Guadalajarahomas Destyni Hoppel, MD  ? ?F/U sleep evaluation initially referred through the courtesy of Dr. Ailene ArdsArvind Jarlwala at [facility] for evaluation of possible sleep apnea. ? ? ?History of Present Illness:  ?Samuel Patel is a 56 y.o. male who was born in [place] ultimately moved to [place] where he lived for 12 years.  He moved to [place] approximately 10 years ago but continues to see Dr. Ailene RavelJariwala for his primary care. ? ?Mr. [name] was seen in 2015 by Dr. Ronny Flurryom Brackbill after experiencing an episode of paroxysmal atrial fibrillation.  He converted to sinus rhythm with a diltiazem drip.  At the time his Chadsvasc score was 0 and he was not started on anticoagulation.   ? ?He was referred to me by Dr. 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  When I initially saw him on June 18, 2021 he informed me that he was not sleeping well.  Upon questioning he snores loudly, has awakened gasping for breath, has nocturia 2-3 times per night, and his sleep is nonrestorative.  He denied significant daytime sleepiness.  It is very difficult for him to sleep on his back.  He typically goes to bed around midnight and wakes up at 5:30 AM.  Epworth Sleepiness Scale score was calculated in the office today and this endorsed at 8.  He was  referred by his primary physician for assessment of sleep apnea.  During my initial evaluation on June 18, 2021 I had a lengthy discussion with him regarding sleep apnea and its effects on normal sleep architecture and potential cardiovascular ramifications if left untreated.  He had recently been started on Ozempic for obesity and at that time was on a blood pressure regimen of olmesartan 40 mg, Toprol-XL 25 mg and  amlodipine 5 mg. ? ?Since I last saw him, he underwent a sleep study on September 22, 2021.  He was found to have very severe obstructive sleep apnea with an AHI of 90.9/h.  There was severe oxygen desaturation to a nadir of 65% and time spent less than 89% was 182 minutes.  Due to his very severe sleep apnea I recommended an expeditious in lab CPAP/potential BiPAP titration.  We are waiting for insurance authorization for this study but immediately after I saw the patient today, we were notified by the insurance company that they denied an in lab titration.  As result, the plan will be to initiate AutoPap therapy with EPR of 3 at a 8 to 20 cm of water initial setting.  He presents for evaluation. ? ? ?Past Medical History:  ?Diagnosis Date  ? Anxiety   ? Elevated cholesterol   ? ? ?Past Surgical History:  ?Procedure Laterality Date  ? APPENDECTOMY    ? CHOLECYSTECTOMY    ? ? ?Current Medications: ?Outpatient Medications Prior to Visit  ?Medication Sig Dispense Refill  ? amLODipine (NORVASC) 5 MG tablet Take 5 mg by mouth daily.    ? aspirin EC 81 MG EC tablet Take 1 tablet (81 mg total) by mouth daily.    ? atorvastatin (LIPITOR) 10 MG tablet Take 10 mg by mouth daily.    ? celecoxib (CELEBREX) 200 MG capsule Take 200 mg by mouth daily.    ?  gabapentin (NEURONTIN) 300 MG capsule Take 300 mg by mouth daily.    ? metoprolol succinate (TOPROL-XL) 25 MG 24 hr tablet Take 1 tablet (25 mg total) by mouth daily. Please call an schedule an appt for additional refills 90 tablet 0  ? olmesartan (BENICAR) 40 MG tablet Take 40 mg by mouth daily.    ? omeprazole (PRILOSEC) 10 MG capsule Take 10 mg by mouth daily.    ? OZEMPIC, 1 MG/DOSE, 4 MG/3ML SOPN Inject 1 mg into the skin once a week.    ? OZEMPIC, 2 MG/DOSE, 8 MG/3ML SOPN Inject 2 mg into the skin once a week.    ? PARoxetine (PAXIL) 40 MG tablet Take 40 mg by mouth every morning.    ? ?No facility-administered medications prior to visit.  ?  ? ?Allergies:   Patient has no  known allergies.  ? ?Social History  ? ?Socioeconomic History  ? Marital status: Divorced  ?  Spouse name: Not on file  ? Number of children: Not on file  ? Years of education: Not on file  ? Highest education level: Not on file  ?Occupational History  ? Not on file  ?Tobacco Use  ? Smoking status: Every Day  ?  Types: Cigarettes  ? Smokeless tobacco: Never  ?Substance and Sexual Activity  ? Alcohol use: Yes  ?  Comment: occassionally  ? Drug use: No  ? Sexual activity: Yes  ?Other Topics Concern  ? Not on file  ?Social History Narrative  ? Not on file  ? ?Social Determinants of Health  ? ?Financial Resource Strain: Not on file  ?Food Insecurity: Not on file  ?Transportation Needs: Not on file  ?Physical Activity: Not on file  ?Stress: Not on file  ?Social Connections: Not on file  ? ?Socially he is divorced and has a daughter.  Remotely had been on Truvada which he is no longer taking.  His parents are from [place]  He has a tobacco history.  Currently smokes a quarter of a pack per day and has been smoking for 30 years.  He works in Airline pilot for [employer].  He has not been exercising due to "laziness." ? ?Family History:  The patient's family history includes Diabetes in his mother.  ? ?His mother is 60 years old and has a history of hypertension, type 2 diabetes mellitus and hyperlipidemia.  His father is 72 years old and has a history of esophageal cancer, hyperlipidemia and diabetes.  Has 2 brothers and 1 sister. ? ?ROS ?General: Negative; No fevers, chills, or night sweats; obesity ?HEENT: Negative; No changes in vision or hearing, sinus congestion, difficulty swallowing ?Pulmonary: Negative; No cough, wheezing, shortness of breath, hemoptysis ?Cardiovascular: Negative; No chest pain, presyncope, syncope, palpitations ?GI: History of polyps ?GU: Negative; No dysuria, hematuria, or difficulty voiding ?Musculoskeletal: Cervical and lumbar degenerative disc disease ?Hematologic/Oncology: Negative; no easy  bruising, bleeding ?Endocrine: Negative; no heat/cold intolerance; no diabetes ?Neuro: Negative; no changes in balance, headaches ?Skin: Negative; No rashes or skin lesions ?Psychiatric: Negative; No behavioral problems, depression ?Sleep: Positive for snoring, frequent awakenings, nonrestorative sleep, awakening gasping for breath, nocturia 2-3 times per night; daytime sleepiness;  no bruxism, restless legs, hypnogognic hallucinations, no cataplexy ? ? ?Epworth Sleepiness Scale: 10/08/2021 ?Situation   Chance of Dozing/Sleeping (0 = never , 1 = slight chance , 2 = moderate chance , 3 = high chance )  ? sitting and reading 2  ? watching TV 2  ? sitting inactive  in a public place 3  ? being a passenger in a motor vehicle for an hour or more 1  ? lying down in the afternoon 3  ? sitting and talking to someone 0  ? sitting quietly after lunch (no alcohol) 1  ? while stopped for a few minutes in traffic as the driver 0  ? Total Score  12  ?  ?Other comprehensive 14 point system review is negative. ? ? ?PHYSICAL EXAM:   ?VS:  BP 126/88   Pulse 82   Ht 5\' 11"  (1.803 m)   Wt 282 lb 6.4 oz (128.1 kg)   SpO2 97%   BMI 39.39 kg/m?    ? ?Repeat blood pressure by me was 134/84 ? ?Wt Readings from Last 3 Encounters:  ?10/08/21 282 lb 6.4 oz (128.1 kg)  ?06/18/21 275 lb 9.6 oz (125 kg)  ?06/03/14 268 lb 3.2 oz (121.7 kg)  ?  ?General: Alert, oriented, no distress.  ?Skin: normal turgor, no rashes, warm and dry ?HEENT: Normocephalic, atraumatic. Pupils equal round and reactive to light; sclera anicteric; extraocular muscles intact;  ?Nose without nasal septal hypertrophy ?Mouth/Parynx benign; Mallinpatti scale 3 ?Neck: No JVD, no carotid bruits; normal carotid upstroke ?Lungs: clear to ausculatation and percussion; no wheezing or rales ?Chest wall: without tenderness to palpitation ?Heart: PMI not displaced, RRR, s1 s2 normal, 1/6 systolic murmur, no diastolic murmur, no rubs, gallops, thrills, or heaves ?Abdomen: soft,  nontender; no hepatosplenomehaly, BS+; abdominal aorta nontender and not dilated by palpation. ?Back: no CVA tenderness ?Pulses 2+ ?Musculoskeletal: full range of motion, normal strength, no joint deformities ?Extremities

## 2021-10-10 ENCOUNTER — Telehealth: Payer: Self-pay | Admitting: *Deleted

## 2021-10-10 NOTE — Telephone Encounter (Signed)
CPAP titration has been denied by [facility]. APAP ordered per Dr Landry Dyke orders. ?

## 2021-10-13 ENCOUNTER — Encounter: Payer: Self-pay | Admitting: Cardiovascular Disease

## 2021-11-19 ENCOUNTER — Encounter: Payer: Self-pay | Admitting: Cardiovascular Disease

## 2021-11-29 ENCOUNTER — Ambulatory Visit: Payer: 59 | Admitting: Cardiovascular Disease

## 2021-11-29 ENCOUNTER — Encounter: Payer: Self-pay | Admitting: Cardiovascular Disease

## 2021-11-29 DIAGNOSIS — G4733 Obstructive sleep apnea (adult) (pediatric): Secondary | ICD-10-CM

## 2021-11-29 DIAGNOSIS — I480 Paroxysmal atrial fibrillation: Secondary | ICD-10-CM

## 2021-11-29 DIAGNOSIS — G4736 Sleep related hypoventilation in conditions classified elsewhere: Secondary | ICD-10-CM

## 2021-11-29 DIAGNOSIS — E785 Hyperlipidemia, unspecified: Secondary | ICD-10-CM

## 2021-11-29 DIAGNOSIS — I10 Essential (primary) hypertension: Secondary | ICD-10-CM

## 2021-11-29 DIAGNOSIS — Z6839 Body mass index (BMI) 39.0-39.9, adult: Secondary | ICD-10-CM

## 2021-11-29 NOTE — Progress Notes (Signed)
Cardiology Office Note    Date:  12/06/2021   ID:  Samuel Patel, DOB 12-Jul-1966, MRN [id number]  PCP:  Wilson Singer, MD  Cardiologist:  Nicki Guadalajara, MD   F/U sleep evaluation initially referred through the courtesy of Dr. Ailene Ards at [facility] for evaluation of possible sleep apnea.   History of Present Illness:  Samuel Patel is a 56 y.o. male who was born in [place],  [place] ultimately moved to [place] where he lived for 12 years.  He moved to [place] approximately 10 years ago but continues to see Dr. Ailene Ravel for his primary care.  [name] was seen in 2015 by Dr. Ronny Flurry after experiencing an episode of paroxysmal atrial fibrillation.  He converted to sinus rhythm with a diltiazem drip.  At the time his Chadsvasc score was 0 and he was not started on anticoagulation.    He was referred to me by Dr. Ailene Ravel and has a history of obesity, degenerative disc disease of cervical and lumbar disks, tobacco use and has noted occasional palpitations.  When I initially saw him on June 18, 2021 he informed me that he was not sleeping well.  Upon questioning he snores loudly, has awakened gasping for breath, has nocturia 2-3 times per night, and his sleep is nonrestorative.  He denied significant daytime sleepiness.  It is very difficult for him to sleep on his back.  He typically goes to bed around midnight and wakes up at 5:30 AM.  Epworth Sleepiness Scale score was calculated in the office today and this endorsed at 8.  He was  referred by his primary physician for assessment of sleep apnea.  During my initial evaluation on June 18, 2021 I had a lengthy discussion with him regarding sleep apnea and its effects on normal sleep architecture and potential cardiovascular ramifications if left untreated.  He had recently been started on Ozempic for obesity and at that time was on a blood pressure regimen of olmesartan 40 mg, Toprol-XL 25 mg and  amlodipine 5 mg.  I saw him for follow-up evaluation on October 08, 2021.  He underwent a sleep study on September 22, 2021.  He was found to have very severe obstructive sleep apnea with an AHI of 90.9/h.  There was severe oxygen desaturation to a nadir of 65% and time spent less than 89% was 182 minutes.  Due to his very severe sleep apnea I recommended an expeditious in lab CPAP/potential BiPAP titration.  We are waiting for insurance authorization for this study but immediately after I saw the patient today, we were notified by the insurance company that they denied an in lab titration.  As result, the plan will be to initiate AutoPap therapy with EPR of 3 at a 8 to 20 cm of water initial setting.   [name] received a new ResMed air sense 11 CPAP AutoSet unit on October 12, 2021.  [facility] as his DME company.  An initial download was done from March 24 through November 10, 2021 verified he was meeting compliance standards.  Average use was 5 hours and 13 minutes.  AHI was 11.  A subsequent download from April 11 through Nov 28, 2021 showed an AHI of 12.0.  On May 1, his pressure settings were increased to 13 - 20 range and download on this increased pressure showed an AHI of 12.1 with his 95th percentile pressure at 18.5 and maximum average pressure 19.2.  He still believes  he is not getting enough pressure.  He is sleeping much better.  He is a mouth breather and has a chinstrap.  He continues to be on amlodipine 5 mg, metoprolol succinate 25 mg, telmisartan 40 mg daily for hypertension.  He is now on Ozempic which was recently started for weight loss.  He takes Paxil for anxiety.  He is on gabapentin which she takes on an as-needed basis for neuropathy and is on atorvastatin 10 mg.  He presents for follow-up evaluation.  Past Medical History:  Diagnosis Date   Anxiety    Elevated cholesterol     Past Surgical History:  Procedure Laterality Date   APPENDECTOMY     CHOLECYSTECTOMY       Current Medications: Outpatient Medications Prior to Visit  Medication Sig Dispense Refill   amLODipine (NORVASC) 5 MG tablet Take 5 mg by mouth daily.     atorvastatin (LIPITOR) 10 MG tablet Take 10 mg by mouth daily.     celecoxib (CELEBREX) 200 MG capsule Take 200 mg by mouth daily.     metoprolol succinate (TOPROL-XL) 25 MG 24 hr tablet Take 1 tablet (25 mg total) by mouth daily. Please call an schedule an appt for additional refills 90 tablet 0   olmesartan (BENICAR) 40 MG tablet Take 40 mg by mouth daily.     omeprazole (PRILOSEC) 10 MG capsule Take 10 mg by mouth daily.     OZEMPIC, 2 MG/DOSE, 8 MG/3ML SOPN Inject 2 mg into the skin once a week.     PARoxetine (PAXIL) 40 MG tablet Take 40 mg by mouth every morning.     aspirin EC 81 MG EC tablet Take 1 tablet (81 mg total) by mouth daily. (Patient not taking: Reported on 11/29/2021)     gabapentin (NEURONTIN) 300 MG capsule Take 300 mg by mouth daily. (Patient not taking: Reported on 11/29/2021)     OZEMPIC, 1 MG/DOSE, 4 MG/3ML SOPN Inject 1 mg into the skin once a week. (Patient not taking: Reported on 11/29/2021)     No facility-administered medications prior to visit.     Allergies:   Patient has no known allergies.   Social History   Socioeconomic History   Marital status: Divorced    Spouse name: Not on file   Number of children: Not on file   Years of education: Not on file   Highest education level: Not on file  Occupational History   Not on file  Tobacco Use   Smoking status: Every Day    Types: Cigarettes   Smokeless tobacco: Never  Substance and Sexual Activity   Alcohol use: Yes    Comment: occassionally   Drug use: No   Sexual activity: Yes  Other Topics Concern   Not on file  Social History Narrative   Not on file   Social Determinants of Health   Financial Resource Strain: Not on file  Food Insecurity: Not on file  Transportation Needs: Not on file  Physical Activity: Not on file  Stress: Not  on file  Social Connections: Not on file   Socially he is divorced and has a daughter.  Remotely had been on Truvada which he is no longer taking.  His parents are from [place]  He has a tobacco history.  Currently smokes a quarter of a pack per day and has been smoking for 30 years.  He works in Airline pilot for [employer].  He has not been exercising due to "laziness."  Family  History:  The patient's family history includes Diabetes in his mother.   His mother is 37 years old and has a history of hypertension, type 2 diabetes mellitus and hyperlipidemia.  His father is 84 years old and has a history of esophageal cancer, hyperlipidemia and diabetes.  Has 2 brothers and 1 sister.  ROS General: Negative; No fevers, chills, or night sweats; obesity HEENT: Negative; No changes in vision or hearing, sinus congestion, difficulty swallowing Pulmonary: Negative; No cough, wheezing, shortness of breath, hemoptysis Cardiovascular: Negative; No chest pain, presyncope, syncope, palpitations GI: History of polyps GU: Negative; No dysuria, hematuria, or difficulty voiding Musculoskeletal: Cervical and lumbar degenerative disc disease Hematologic/Oncology: Negative; no easy bruising, bleeding Endocrine: Negative; no heat/cold intolerance; no diabetes Neuro: Negative; no changes in balance, headaches Skin: Negative; No rashes or skin lesions Psychiatric: Negative; No behavioral problems, depression Sleep: Positive for snoring, frequent awakenings, nonrestorative sleep, awakening gasping for breath, nocturia 2-3 times per night; daytime sleepiness;  no bruxism, restless legs, hypnogognic hallucinations, no cataplexy   Epworth Sleepiness Scale: 10/08/2021 Situation   Chance of Dozing/Sleeping (0 = never , 1 = slight chance , 2 = moderate chance , 3 = high chance )   sitting and reading 2   watching TV 2   sitting inactive in a public place 3   being a passenger in a motor vehicle for an hour or more 1    lying down in the afternoon 3   sitting and talking to someone 0   sitting quietly after lunch (no alcohol) 1   while stopped for a few minutes in traffic as the driver 0   Total Score  12     Since initiating CPAP, arepeat Epworth scale was endorsed in the office today (11/28/21) and this was improved at 6.  Other comprehensive 14 point system review is negative.   PHYSICAL EXAM:   VS:  BP 125/60   Pulse 72   Ht  (1.803 m)   Wt 278 lb 12.8 oz (126.5 kg)   SpO2 97%   BMI 38.88 kg/m     Repeat blood pressure was 120/64  Wt Readings from Last 3 Encounters:  11/29/21 278 lb 12.8 oz (126.5 kg)  10/08/21 282 lb 6.4 oz (128.1 kg)  06/18/21 275 lb 9.6 oz (125 kg)    General: Alert, oriented, no distress.  Skin: normal turgor, no rashes, warm and dry HEENT: Normocephalic, atraumatic. Pupils equal round and reactive to light; sclera anicteric; extraocular muscles intact; Fundi ** Nose without nasal septal hypertrophy Mouth/Parynx benign; Mallinpatti scale 3 Neck: No JVD, no carotid bruits; normal carotid upstroke Lungs: clear to ausculatation and percussion; no wheezing or rales Chest wall: without tenderness to palpitation Heart: PMI not displaced, RRR, s1 s2 normal, 1/6 systolic murmur, no diastolic murmur, no rubs, gallops, thrills, or heaves Abdomen: soft, nontender; no hepatosplenomehaly, BS+; abdominal aorta nontender and not dilated by palpation. Back: no CVA tenderness Pulses 2+ Musculoskeletal: full range of motion, normal strength, no joint deformities Extremities: no clubbing cyanosis or edema, Homan's sign negative  Neurologic: grossly nonfocal; Cranial nerves grossly wnl Psychologic: Normal mood and affect      Studies/Labs Reviewed:   Nov 29, 2021 ECG (independently read by me): Normal sinus rhythm at 72 bpm.  No ectopy.  Normal intervals  June 18, 2021 ECG (independently read by me):  NSR at 79, no ectopy, , normal intervals  Recent Labs:     Latest Ref Rng & Units 05/21/2017  12:02 PM 05/03/2014    3:58 AM 05/02/2014   12:55 PM  BMP  Glucose 65 - 99 mg/dL 94   240   973    BUN 6 - 20 mg/dL 12   13   19     Creatinine 0.61 - 1.24 mg/dL   5.32   9.92    Sodium 135 - 145 mmol/L 137   137   140    Potassium 3.5 - 5.1 mmol/L 4.4   3.9   4.1    Chloride 101 - 111 mmol/L 102   102   101    CO2 22 - 32 mmol/L 25   23   23     Calcium 8.9 - 10.3 mg/dL 9.4   8.6   9.5           View : No data to display.             Latest Ref Rng & Units 05/21/2017   12:02 PM 05/03/2014    3:58 AM 05/02/2014   12:55 PM  CBC  WBC 4.0 - 10.5 K/uL 8.4   9.1   9.5    Hemoglobin 13.0 - 17.0 g/dL 05/05/2014   07/02/2014   83.4    Hematocrit 39.0 - 52.0 % 46.3   40.7   46.4    Platelets 150 - 400 K/uL 251   217   272     Lab Results  Component Value Date   MCV 91.0 05/21/2017   MCV 88.3 05/03/2014   MCV 86.9 05/02/2014   Lab Results  Component Value Date   TSH 1.550 05/02/2014   Lab Results  Component Value Date   HGBA1C 5.7 (H) 05/02/2014     BNP No results found for: BNP  ProBNP    Component Value Date/Time   PROBNP 656.8 (H) 05/02/2014 1255     Lipid Panel  No results found for: CHOL, TRIG, HDL, CHOLHDL, VLDL, LDLCALC, LDLDIRECT, LABVLDL   RADIOLOGY: No results found.   Additional studies/ records that were reviewed today include:  I reviewed the records from Dr.Jariwala at [facility] in [place].  2015 records from [facility] were reviewed  Downloads were obtained from March 24 through November 10, 2021; April 11 to Nov 28, 2021; in May 1 through Nov 29, 2021.   09/21/2021 CLINICAL INFORMATION Sleep Study Type: HST   Indication for sleep study: snoring, awakening gasping for breath, nocturia, inability to sleep on back, non-restorative sleep   Epworth Sleepiness Score: 7   SLEEP STUDY TECHNIQUE A multi-channel overnight portable sleep study was performed. The channels recorded  were: nasal airflow, thoracic respiratory movement, and oxygen saturation with a pulse oximetry. Snoring was also monitored.   MEDICATIONS amLODipine (NORVASC) 5 MG tablet aspirin EC 81 MG EC tablet atorvastatin (LIPITOR) 10 MG tablet celecoxib (CELEBREX) 200 MG capsule gabapentin (NEURONTIN) 300 MG capsule metoprolol succinate (TOPROL-XL) 25 MG 24 hr tablet olmesartan (BENICAR) 40 MG tablet omeprazole (PRILOSEC) 10 MG capsule OZEMPIC, 1 MG/DOSE, 4 MG/3ML SOPN PARoxetine (PAXIL) 40 MG tablet  Patient self administered medications include: N/A.   SLEEP ARCHITECTURE Patient was studied for 375 minutes. The sleep efficiency was 100.0 % and the patient was supine for 99.5%. The arousal index was 0.0 per hour.   RESPIRATORY PARAMETERS The overall AHI was 90.9 per hour, with a central apnea index of 0 per hour.   The oxygen nadir was 65% during sleep.   CARDIAC DATA Mean heart rate  during sleep was 77.6 bpm.   IMPRESSIONS - Very severe obstructive sleep apnea occurred during this study (AHI 90.9/h). - Severe oxygen desaturation to a nadir of 65%. Time spent < 89% was 182 minutes. - Patient snored for 95.3 minutes (25.4%) during the sleep.   DIAGNOSIS - Obstructive Sleep Apnea (G47.33) - Nocturnal Hypoxemia (G47.36)   RECOMMENDATIONS - In this patient with very severe sleep apnea recommend an expeditious in-lab CPAP/BiPAP titration study. If unable to obtain an in- lab evaluation, initiate Auto-PAP with EPR of 3 at 8 - 20 cm of water. - Effort should be made to optimize nasal and oropharyngeal patency. - Positional therapy avoiding supine position during sleep. - Avoid alcohol, sedatives and other CNS depressants that may worsen sleep apnea and disrupt normal sleep architecture. - Sleep hygiene should be reviewed to assess factors that may improve sleep quality. - Weight management (BMI 38) and regular exercise should be initiated or continued. - Return to Sleep Center to discuss  the results of this study    ASSESSMENT:    1. Severe obstructive sleep apnea (adult) (pediatric)   2. Hypoxemia associated with sleep   3. Primary hypertension   4. Paroxysmal atrial fibrillation (HCC)   5. Class 2 severe obesity due to excess calories with serious comorbidity and body mass index (BMI) of 38.0 to 38.9 in adult (HCC)   6. Hyperlipidemia, unspecified hyperlipidemia type     PLAN:  Mr. Yohannes Waibel is a very pleasant 56 year old Hispanic male family history originating in [place]  He was born in [place], [place].  In 2015, he had an episode of paroxysmal atrial fibrillation and he was pharmacologically cardioverted with IV diltiazem.  An echo Doppler study at that time showed an EF of 55 to 60%.  At the time he was started on Toprol-XL.  Although he moved to [place] 10 years ago from [place], he still goes to [facility] for his primary care.  He was placed on Ozempic for obesity and he has a history of hypertension and is on amlodipine 5 mg, metoprolol succinate 25 mg, and olmesartan 40 mg daily.  He also has a history of anxiety for which he takes paroxetine 40 mg.  He takes gabapentin 300 mg for peripheral neuropathy with possible restless legs.  He has been on low-dose atorvastatin for hyperlipidemia.  At my initial evaluation, he described a long history of not sleeping well.   He was symptomatic with loud snoring, awakening gasping for breath, his sleep was nonrestorative, was experiencing nocturia approximately 3 times per night, and was unable to sleep on his back.  I reviewed with him his home sleep study which was notable for demonstrating severe sleep apnea with an AHI of 90.9/h.  He had severe oxygen desaturation to a nadir of 65% and spent over 3 hours less than 89%.  I recommended he undergo an expeditious in lab CPAP with potential need for BiPAP titration due to the severity of symptoms hematology and his cardiovascular risk factors.  Unfortunately, an in lab  titration was denied and he has subsequently been started on AutoPap therapy.  His initial pressure settings was a range of 8 to 20 cm of water which was subsequently increased on Nov 19, 2021 to 13 to 20 cm of water.  AHI continues to be elevated at 12.0.  As result, I am further increasing his CPAP settings to a range of 17 to 20 cm of water.  If he continues to have significant events,  he may require BiPAP therapy in order to achieve higher pressure.  His sleeping is significantly improved.  He is no longer aware of snoring.  An Epworth Sleepiness Scale score calculated at 6 today showing marked improvement from previously.  We will repeat a download in 1 month.  I will see him in 6 months for follow-up evaluation or sooner as needed.    Medication Adjustments/Labs and Tests Ordered: Current medicines are reviewed at length with the patient today.  Concerns regarding medicines are outlined above.  Medication changes, Labs and Tests ordered today are listed in the Patient Instructions below. Patient Instructions  Medication Instructions:  NO CHANGES   *If you need a refill on your cardiac medications before your next appointment, please call your pharmacy*   Lab Work:  NOT NEEDED .   Testing/Procedures: NOT NEEDED   Follow-Up: At [facility], you and your health needs are our priority.  As part of our continuing mission to provide you with exceptional heart care, we have created designated Provider Care Teams.  These Care Teams include your primary Cardiologist (physician) and Advanced Practice Providers (APPs -  Physician Assistants and Nurse Practitioners) who all work together to provide you with the care you need, when you need it.     Your next appointment:   6 month(s) SLEEP  The format for your next appointment:   In Person  Provider:   Nicki Guadalajarahomas Shonnie Poudrier, MD    Other Instructions   pressure changes were done to your C-PA   Signed, Nicki Guadalajarahomas Janellie Tennison, MD  12/06/2021 5:20 PM     [facility] [address], [address], [place], [place]  [id number] Phone: [phone]

## 2021-11-29 NOTE — Patient Instructions (Addendum)
Medication Instructions:  NO CHANGES   *If you need a refill on your cardiac medications before your next appointment, please call your pharmacy*   Lab Work:  NOT NEEDED .   Testing/Procedures: NOT NEEDED   Follow-Up: At [facility], you and your health needs are our priority.  As part of our continuing mission to provide you with exceptional heart care, we have created designated Provider Care Teams.  These Care Teams include your primary Cardiologist (physician) and Advanced Practice Providers (APPs -  Physician Assistants and Nurse Practitioners) who all work together to provide you with the care you need, when you need it.     Your next appointment:   6 month(s) SLEEP  The format for your next appointment:   In Person  Provider:   Shelva Majestic, MD    Other Instructions   pressure changes were done to your C-PA

## 2021-12-06 ENCOUNTER — Encounter: Payer: Self-pay | Admitting: Cardiovascular Disease

## 2022-06-05 ENCOUNTER — Ambulatory Visit: Payer: 59 | Admitting: Cardiovascular Disease

## 2022-06-19 ENCOUNTER — Telehealth: Payer: Self-pay | Admitting: Cardiovascular Disease

## 2022-06-19 MED ORDER — METOPROLOL SUCCINATE ER 25 MG PO TB24: 25.0000 mg | ORAL_TABLET | Freq: Every day | ORAL | 0 refills | Status: DC

## 2022-06-19 NOTE — Telephone Encounter (Signed)
Spoke with dr Tresa Endo, resume Metoprolol Succ 25mg  daily. Call if palpitations persist.  Pt notified, verbalized understanding. Refill sent to requested pharmacy. He will call if palpitations persist. He will go to the ER if needed.

## 2022-06-19 NOTE — Telephone Encounter (Signed)
Returned call to pt, he states that for the last week to week and a 1/2 he has been experiencing palpitations maybe 1 maybe 2 times a day. He describes these as "strong palpitations" they happen when he is at rest. His last one was about 1pm. He ws sitting at his computer working. So he was not active, the time before he was not active either he was "sitting on the couch". His BP at 1pm with the palpitations was 144/90 he did not get the HR but he states that he felt like his heart was racing. He states that he does not think this is his AFIB, it feels different. He took his BP while I was on the phone and it is OK 120/87 HR 76. But he is not having them right now. He will intermittently have pressure on the left side of his neck but is not when he is having the palpitations "it comes out of nowhere" and can't link it to anything. He states that he is not taking his Metoprolol Succ, he states that he has not taken it "for a long time" but cannot tell me when he stopped. He states that he was here for his appointment in May and he was not taking it then. Pt has appt 07-05-22. OK to wait for appt? Will discuss this with Dr Tresa Endo.

## 2022-06-19 NOTE — Telephone Encounter (Signed)
Patient c/o Palpitations:  High priority if patient c/o lightheadedness, shortness of breath, or chest pain  How long have you had palpitations/irregular HR/ Afib? Are you having the symptoms now? Last episode 5 mins ago. Pt states started a week ago.   Are you currently experiencing lightheadedness, SOB or CP? No   Do you have a history of afib (atrial fibrillation) or irregular heart rhythm? Yes  Have you checked your BP or HR? (document readings if available): 144/90   Are you experiencing any other symptoms? Pt states that he is experiencing shakes and pressure on left side of his neck.

## 2022-07-05 ENCOUNTER — Ambulatory Visit: Payer: 59 | Admitting: Cardiovascular Disease

## 2022-07-11 ENCOUNTER — Other Ambulatory Visit: Payer: Self-pay | Admitting: Cardiovascular Disease

## 2022-08-27 ENCOUNTER — Encounter: Payer: Self-pay | Admitting: Cardiovascular Disease

## 2022-08-27 ENCOUNTER — Ambulatory Visit: Payer: Commercial Managed Care - PPO | Attending: Cardiovascular Disease | Admitting: Cardiovascular Disease

## 2022-08-27 VITALS — BP 124/74 | HR 77 | Ht 71.0 in | Wt 270.0 lb

## 2022-08-27 DIAGNOSIS — G4736 Sleep related hypoventilation in conditions classified elsewhere: Secondary | ICD-10-CM

## 2022-08-27 DIAGNOSIS — I10 Essential (primary) hypertension: Secondary | ICD-10-CM

## 2022-08-27 DIAGNOSIS — G4733 Obstructive sleep apnea (adult) (pediatric): Secondary | ICD-10-CM | POA: Diagnosis not present

## 2022-08-27 DIAGNOSIS — Z6837 Body mass index (BMI) 37.0-37.9, adult: Secondary | ICD-10-CM

## 2022-08-27 DIAGNOSIS — E785 Hyperlipidemia, unspecified: Secondary | ICD-10-CM

## 2022-08-27 IMAGING — MR MR LUMBAR SPINE W/O CM
4 of 5 series · 18 of 48 positions shown · non-contrast
Comparison: None.

CLINICAL DATA: Low back pain for several months, radiating into the
right leg and foot

EXAM:
MRI LUMBAR SPINE WITHOUT CONTRAST
TECHNIQUE: Multiplanar, multisequence MR imaging of the lumbar spine was
performed. No intravenous contrast was administered.

[Series 5: T2 · sagittal · 4.0mm · 0.73mm/px · 6 of 15 slices shown (1 of 2)]
[im 1/15]
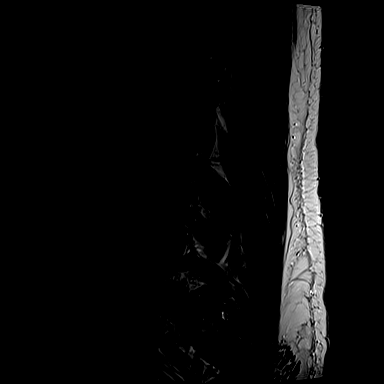
[im 3/15]
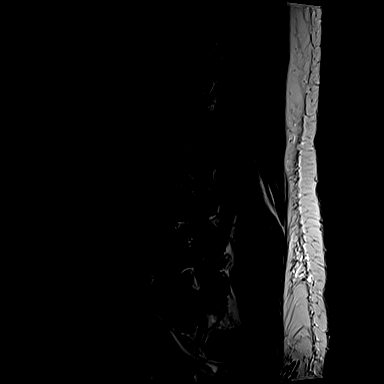
[im 6/15]
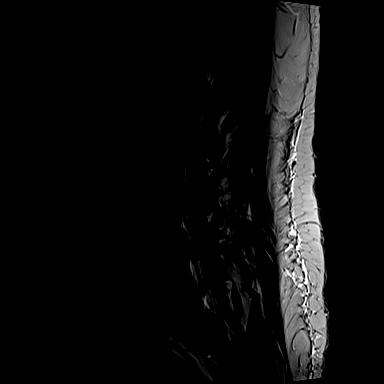
[im 9/15]
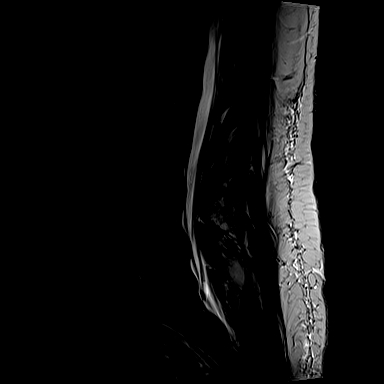
[im 12/15]
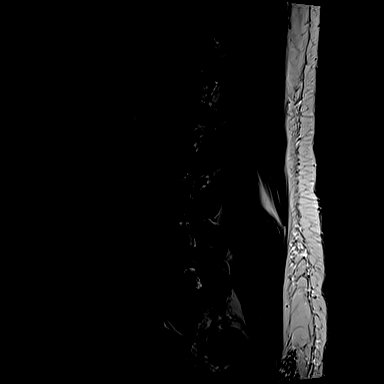
[im 15/15]
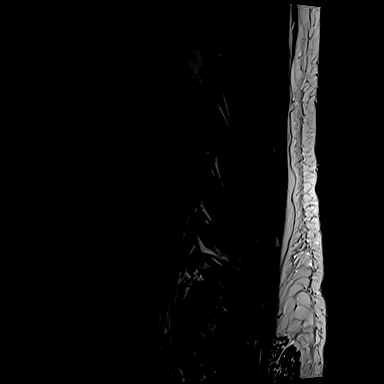

[Series 6: T1 · sagittal · 4.0mm · 0.73mm/px · 3 of 15 slices shown (1 of 2)]
[im 3/15]
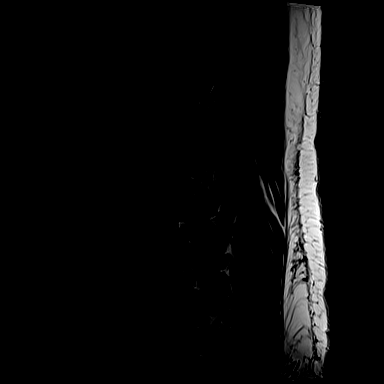
[im 9/15]
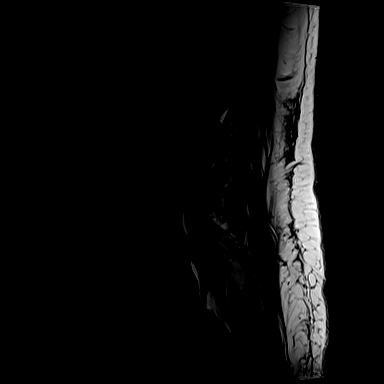
[im 15/15]
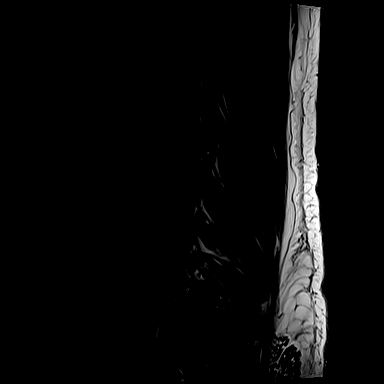

[Series 10: T1 · axial · 4.0mm · 0.28mm/px · z∈[-17,+142]mm · 3 of 39 slices shown (2 of 2)]
[im 6/39]
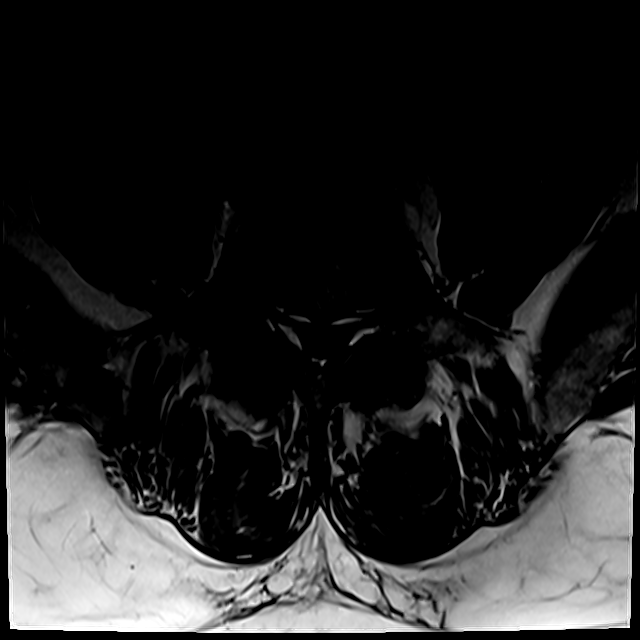
[im 20/39]
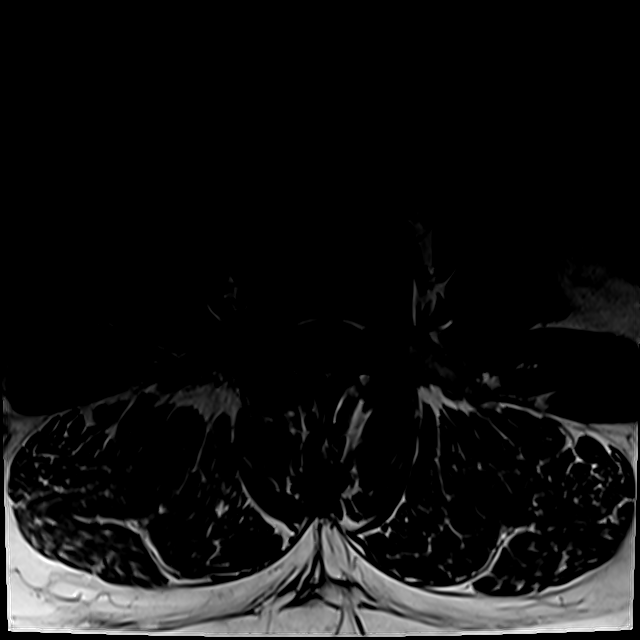
[im 33/39]
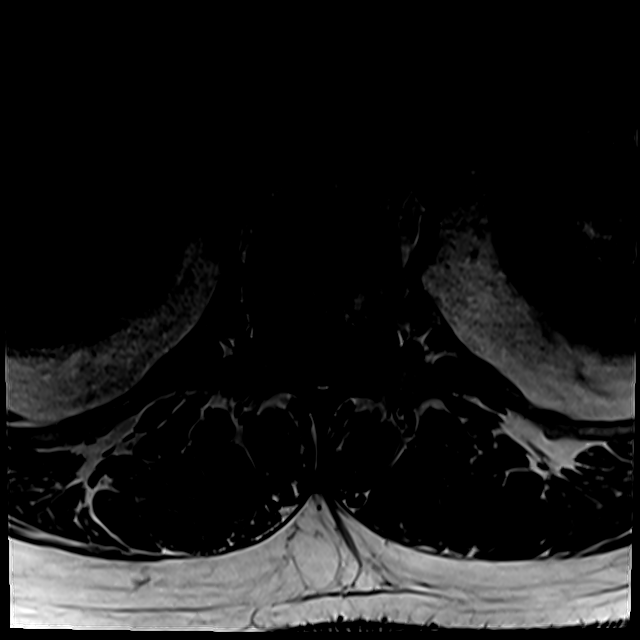

[Series 13: T2 · axial · 4.0mm · 0.28mm/px · z∈[-42,+142]mm · 6 of 39 slices shown (2 of 2)]
[im 1/39]
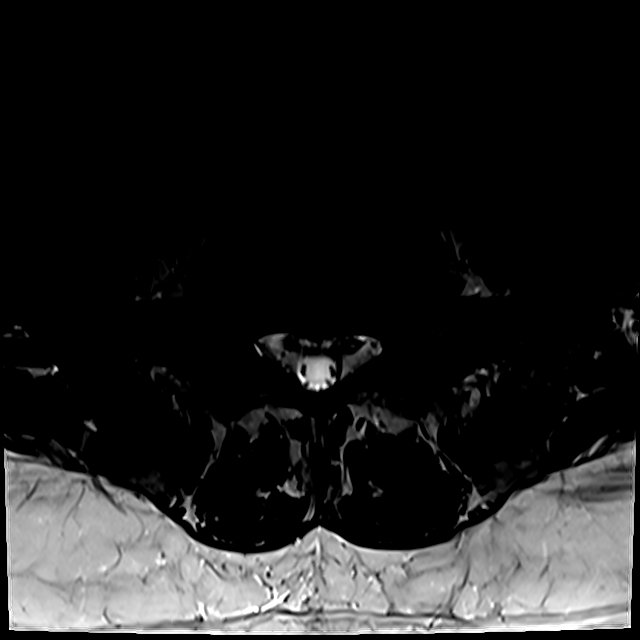
[im 6/39]
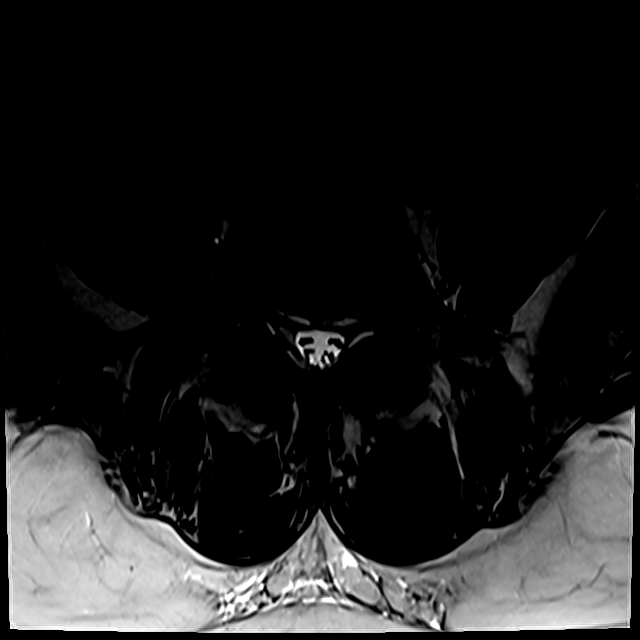
[im 11/39]
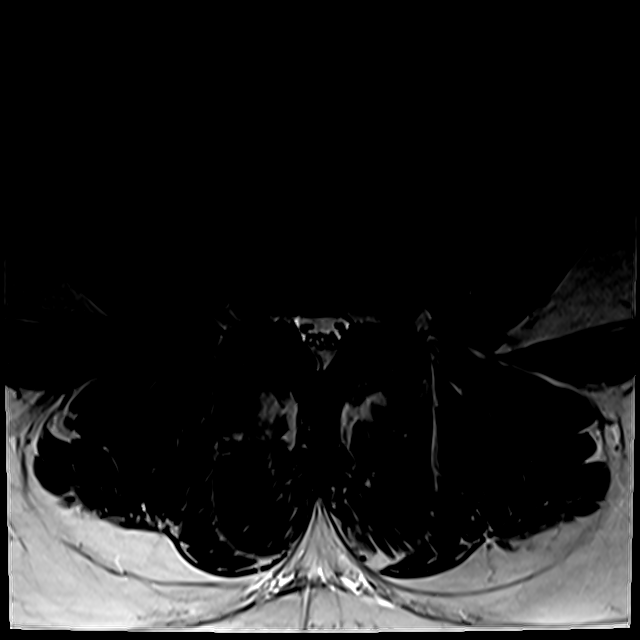
[im 17/39]
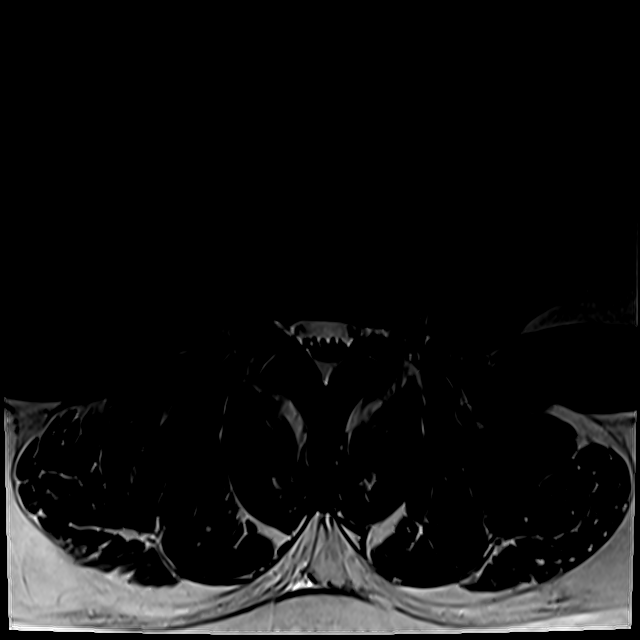
[im 20/39]
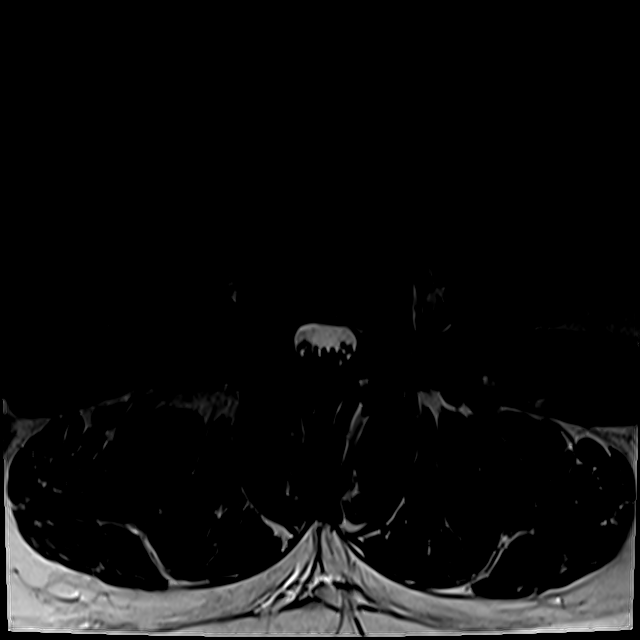
[im 33/39]
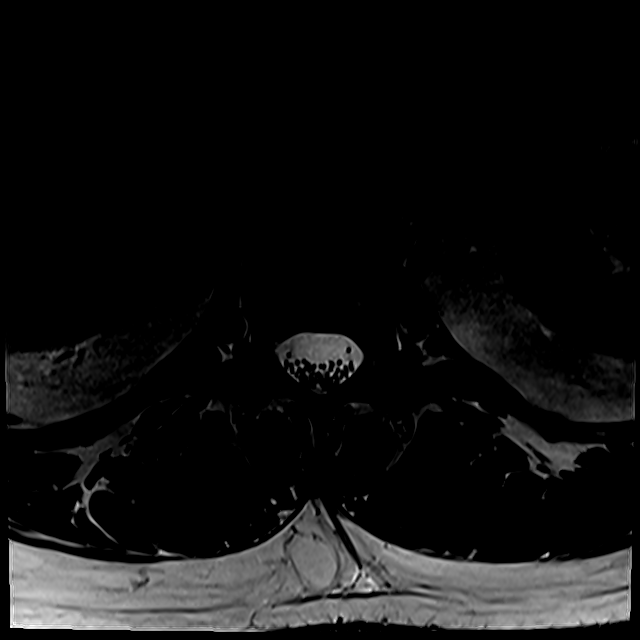

[18 of 48 positions shown; findings below may reference images not displayed]

FINDINGS: Segmentation:  5 lumbar type vertebrae

Alignment:  Slight anterolisthesis at L4-5

Vertebrae:  Degenerative marrow edema about the right L4-5 facet.

Conus medullaris and cauda equina: Conus extends to the L1 level.
Conus and cauda equina appear normal.

Paraspinal and other soft tissues: No perispinal mass or
inflammation is noted.

Disc levels:

T12- L1: Unremarkable.

L1-L2: Mild disc narrowing.  Ventral spurring.  No impingement

L2-L3: Ventral spondylitic spurring that is mild

L3-L4: Mild disc narrowing and bulging with ventral spondylitic
spurring. No neural compression

L4-L5: Facet osteoarthritis with spurring, ligament thickening, and
mild anterolisthesis. Mild disc bulging with moderate right and mild
left foraminal narrowing. Mild triangular narrowing of the thecal
sac

L5-S1:Mild disc bulging greater towards the left where there is also
endplate spurring. Mild facet spurring. No neural compression.
IMPRESSION: 1. Generalized spondylosis and lower lumbar facet degeneration.
There is notable L4-5 facet degeneration with right-sided active
arthritis and mild anterolisthesis.
2. L4-5 moderate right foraminal narrowing.
3. Diffusely patent spinal canal.

## 2022-08-27 NOTE — Patient Instructions (Signed)
Medication Instructions:  The current medical regimen is effective;  continue present plan and medications.  *If you need a refill on your cardiac medications before your next appointment, please call your pharmacy*   Follow-Up: At [facility], you and your health needs are our priority.  As part of our continuing mission to provide you with exceptional heart care, we have created designated Provider Care Teams.  These Care Teams include your primary Cardiologist (physician) and Advanced Practice Providers (APPs -  Physician Assistants and Nurse Practitioners) who all work together to provide you with the care you need, when you need it.  We recommend signing up for the patient portal called "MyChart".  Sign up information is provided on this After Visit Summary.  MyChart is used to connect with patients for Virtual Visits (Telemedicine).  Patients are able to view lab/test results, encounter notes, upcoming appointments, etc.  Non-urgent messages can be sent to your provider as well.   To learn more about what you can do with MyChart, go to [email or web address].    Your next appointment:   12 month(s)  Provider:   Thomas Kelly, MD      

## 2022-08-27 NOTE — Progress Notes (Signed)
Cardiology Office Note    Date:  08/27/2022   ID:  [place], DOB April 16, 1966, MRN [id number]  PCP:  Samuel Flaming, MD  Cardiologist:  Samuel Majestic, MD   9 month F/U sleep evaluation initially referred through the courtesy of Dr. Kennieth [place] at [place] [facility] [facility] for evaluation of possible sleep apnea.   History of Present Illness:  [place] is a 57 y.o. male who was born in [place],  [place] ultimately moved to [place] where he lived for 12 years.  He moved to [place] approximately 10 years ago but continues to see Dr. Jewel [place] for his primary care.  [place] was seen in 2015 by [place] after experiencing an episode of paroxysmal atrial fibrillation.  He converted to sinus rhythm with a diltiazem drip.  At the time his Chadsvasc score was 0 and he was not started on anticoagulation.    He was referred to me by Dr. 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  When I initially saw him on June 18, 2021 he informed me that he was not sleeping well.  Upon questioning he snores loudly, has awakened gasping for breath, has nocturia 2-3 times per night, and his sleep is nonrestorative.  He denied significant daytime sleepiness.  It is very difficult for him to sleep on his back.  He typically goes to bed around midnight and wakes up at 5:30 AM.  Epworth Sleepiness Scale score was calculated in the office today and this endorsed at 8.  He was  referred by his primary physician for assessment of sleep apnea.  During my initial evaluation on June 18, 2021 I had a lengthy discussion with him regarding sleep apnea and its effects on normal sleep architecture and potential cardiovascular ramifications if left untreated.  He had recently been started on Ozempic for obesity and at that time was on a blood pressure regimen of olmesartan 40 mg, Toprol-XL 25  mg and amlodipine 5 mg.  I saw him for follow-up evaluation on October 08, 2021.  He underwent a sleep study on September 22, 2021.  He was found to have very severe obstructive sleep apnea with an AHI of 90.9/h.  There was severe oxygen desaturation to a nadir of 65% and time spent less than 89% was 182 minutes.  Due to his very severe sleep apnea I recommended an expeditious in lab CPAP/potential BiPAP titration.  We are waiting for insurance authorization for this study but immediately after I saw the patient today, we were notified by the insurance company that they denied an in lab titration.  As result, the plan will be to initiate AutoPap therapy with EPR of 3 at a 8 to 20 cm of water initial setting.   I last saw him on Nov 29, 2021.  [place] received a new ResMed air sense 11 CPAP AutoSet unit on October 12, 2021 with  [facility] as his DME company.  An initial download was done from March 24 through November 10, 2021 verified he was meeting compliance standards.  Average use was 5 hours and 13 minutes.  AHI was 11.  A subsequent download from April 11 through Nov 28, 2021 showed an AHI of 12.0.  On May 1, his pressure settings were increased to 13 - 20 range and download on this increased pressure showed an AHI of 12.1  with his 95th percentile pressure at 18.5 and maximum average pressure 19.2.  He still believes he is not getting enough pressure.  He is sleeping much better.  He is a mouth breather and has a chinstrap.  He continues to be on amlodipine 5 mg, metoprolol succinate 25 mg, telmisartan 40 mg daily for hypertension.  He is now on Ozempic which was recently started for weight loss.  He takes Paxil for anxiety.  He is on gabapentin which she takes on an as-needed basis for neuropathy and is on atorvastatin 10 mg.  During that evaluation, I recommended further titration of his CPAP device to a pressure range of 17 to 20 cm of water.  Since I last saw him, he has felt well.  He tells me his  weight had increased to 286 3 months ago but he has lost 16 pounds over the last 3 months.  He continues to use CPAP with 100% compliance.  Typically he goes to bed between 10 and 11 PM but wakes up at 5:30 AM and works in Air[employer].  A download from January 7 through August 26, 2022 confirmed excellent compliance with missing only 1 day of use.  At his pressure range of 17 to 20 cm of water, AHI is excellent at 1.8 and his 95th percentile pressure is 19.0 with maximum average pressure 19.4.  Sleep duration was only 5 hours and 33 minutes.  He continues to be on amlodipine 5 mg, metoprolol succinate 25 mg, and olmesartan 40 mg daily for blood pressure control.  He is on Ozempic weekly for weight loss.  He is on gabapentin for neuropathy.  He is on atorvastatin 10 mg.  He takes Truvada for prevention.  He presents for follow-up evaluation.  Past Medical History:  Diagnosis Date   Anxiety    Elevated cholesterol     Past Surgical History:  Procedure Laterality Date   APPENDECTOMY     CHOLECYSTECTOMY      Current Medications: Outpatient Medications Prior to Visit  Medication Sig Dispense Refill   amLODipine (NORVASC) 5 MG tablet Take 5 mg by mouth daily.     atorvastatin (LIPITOR) 10 MG tablet Take 10 mg by mouth daily.     celecoxib (CELEBREX) 200 MG capsule Take 200 mg by mouth daily.     emtricitabine-tenofovir (TRUVADA) 200-300 MG tablet Take 1 tablet by mouth daily.     gabapentin (NEURONTIN) 100 MG capsule Take 100 mg by mouth 3 (three) times daily as needed.     metoprolol succinate (TOPROL-XL) 25 MG 24 hr tablet Take 1 tablet (25 mg total) by mouth daily. 90 tablet 1   olmesartan (BENICAR) 40 MG tablet Take 40 mg by mouth daily.     omeprazole (PRILOSEC) 10 MG capsule Take 10 mg by mouth as needed.     OZEMPIC, 2 MG/DOSE, 8 MG/3ML SOPN Inject 2 mg into the skin once a week.     PARoxetine (PAXIL) 40 MG tablet Take 40 mg by mouth every morning.     aspirin EC 81 MG EC tablet Take 1 tablet  (81 mg total) by mouth daily. (Patient not taking: Reported on 11/29/2021)     gabapentin (NEURONTIN) 300 MG capsule Take 300 mg by mouth daily. (Patient not taking: Reported on 11/29/2021)     OZEMPIC, 1 MG/DOSE, 4 MG/3ML SOPN Inject 1 mg into the skin once a week. (Patient not taking: Reported on 11/29/2021)     No facility-administered medications prior to visit.  Allergies:   Patient has no known allergies.   Social History   Socioeconomic History   Marital status: Divorced    Spouse name: Not on file   Number of children: Not on file   Years of education: Not on file   Highest education level: Not on file  Occupational History   Not on file  Tobacco Use   Smoking status: Every Day    Types: Cigarettes   Smokeless tobacco: Never  Substance and Sexual Activity   Alcohol use: Yes    Comment: occassionally   Drug use: No   Sexual activity: Yes  Other Topics Concern   Not on file  Social History Narrative   Not on file   Social Determinants of Health   Financial Resource Strain: Not on file  Food Insecurity: Not on file  Transportation Needs: Not on file  Physical Activity: Not on file  Stress: Not on file  Social Connections: Not on file   Socially he is divorced and has a daughter.  Remotely had been on Truvada which he is no longer taking.  His parents are from [place].  He has a tobacco history.  Currently smokes a quarter of a pack per day and has been smoking for 30 years.  He works in Press photographer for [employer].  He has not been exercising due to "laziness."  Family History:  The patient's family history includes Diabetes in his mother.   His mother is 16 years old and has a history of hypertension, type 2 diabetes mellitus and hyperlipidemia.  His father is 61 years old and has a history of esophageal cancer, hyperlipidemia and diabetes.  Has 2 brothers and 1 sister.  ROS General: Negative; No fevers, chills, or night sweats; obesity HEENT: Negative; No  changes in vision or hearing, sinus congestion, difficulty swallowing Pulmonary: Negative; No cough, wheezing, shortness of breath, hemoptysis Cardiovascular: Negative; No chest pain, presyncope, syncope, palpitations GI: History of polyps GU: Negative; No dysuria, hematuria, or difficulty voiding Musculoskeletal: Cervical and lumbar degenerative disc disease Hematologic/Oncology: Negative; no easy bruising, bleeding Endocrine: Negative; no heat/cold intolerance; no diabetes Neuro: Negative; no changes in balance, headaches Skin: Negative; No rashes or skin lesions Psychiatric: Negative; No behavioral problems, depression Sleep: Initially positive for snoring, frequent awakenings, nonrestorative sleep, awakening gasping for breath, nocturia 2-3 times per night; daytime sleepiness;  no bruxism, restless legs, hypnogognic hallucinations, no cataplexy; Symptoms have resolved with CPAP use.   Epworth Sleepiness Scale: 10/08/2021 >> 08/27/2022 Situation   Chance of Dozing/Sleeping (0 = never , 1 = slight chance , 2 = moderate chance , 3 = high chance )   sitting and reading 2 > 1   watching TV 2 > 1   sitting inactive in a public place 3 > 1   being a passenger in a motor vehicle for an hour or more 1 > 0   lying down in the afternoon 3 > 1   sitting and talking to someone 0 > 0   sitting quietly after lunch (no alcohol) 1 > 0   while stopped for a few minutes in traffic as the driver 0 > 0   Total Score  12 >> 4     Since initiating CPAP, arepeat Epworth scale was endorsed in the office (11/28/21) and this was improved at 6;and today (08/27/22)  at 4.   Other comprehensive 14 point system review is negative.   PHYSICAL EXAM:   VS:  BP 124/74  Pulse 77   Ht 5\' 11"  (1.803 m)   Wt 270 lb (122.5 kg)   BMI 37.66 kg/m     Repeat blood pressure was 124/74  Wt Readings from Last 3 Encounters:  08/27/22 270 lb (122.5 kg)  11/29/21 278 lb 12.8 oz (126.5 kg)  10/08/21 282 lb 6.4 oz (128.1  kg)    General: Alert, oriented, no distress.  Skin: normal turgor, no rashes, warm and dry HEENT: Normocephalic, atraumatic. Pupils equal round and reactive to light; sclera anicteric; extraocular muscles intact;  Nose without nasal septal hypertrophy Mouth/Parynx benign; Mallinpatti scale 3 Neck: Thick neck; No JVD, no carotid bruits; normal carotid upstroke Lungs: clear to ausculatation and percussion; no wheezing or rales Chest wall: without tenderness to palpitation Heart: PMI not displaced, RRR, s1 s2 normal, 1/6 systolic murmur, no diastolic murmur, no rubs, gallops, thrills, or heaves Abdomen: soft, nontender; no hepatosplenomehaly, BS+; abdominal aorta nontender and not dilated by palpation. Back: no CVA tenderness Pulses 2+ Musculoskeletal: full range of motion, normal strength, no joint deformities Extremities: no clubbing cyanosis or edema, Homan's sign negative  Neurologic: grossly nonfocal; Cranial nerves grossly wnl Psychologic: Normal mood and affect     Studies/Labs Reviewed:   August 27, 2022 ECG (independently read by me):  NSR at 77      Nov 29, 2021 ECG (independently read by me): Normal sinus rhythm at 72 bpm.  No ectopy.  Normal intervals  June 18, 2021 ECG (independently read by me):  NSR at 79, no ectopy, , normal intervals  Recent Labs:    Latest Ref Rng & Units 05/21/2017   12:02 PM 05/03/2014    3:58 AM 05/02/2014   12:55 PM  BMP  Glucose 65 - 99 mg/dL 94  07/02/2014  706   BUN 6 - 20 mg/dL 12  13  19    Creatinine 0.61 - 1.24 mg/dL 237   6.28   Sodium 135 - 145 mmol/L 137  137  140   Potassium 3.5 - 5.1 mmol/L 4.4  3.9  4.1   Chloride 101 - 111 mmol/L 102  102  101   CO2 22 - 32 mmol/L 25  23  23    Calcium 8.9 - 10.3 mg/dL 9.4  8.6  9.5          No data to display             Latest Ref Rng & Units 05/21/2017   12:02 PM 05/03/2014    3:58 AM 05/02/2014   12:55 PM  CBC  WBC 4.0 - 10.5 K/uL 8.4  9.1  9.5   Hemoglobin 13.0 -  17.0 g/dL 05/23/2017  05/05/2014  07/02/2014   Hematocrit 39.0 - 52.0 % 46.3  40.7  46.4   Platelets 150 - 400 K/uL 251  217  272    Lab Results  Component Value Date   MCV 91.0 05/21/2017   MCV 88.3 05/03/2014   MCV 86.9 05/02/2014   Lab Results  Component Value Date   TSH 1.550 05/02/2014   Lab Results  Component Value Date   HGBA1C 5.7 (H) 05/02/2014     BNP No results found for: "BNP"  ProBNP    Component Value Date/Time   PROBNP 656.8 (H) 05/02/2014 1255     Lipid Panel  No results found for: "CHOL", "TRIG", "HDL", "CHOLHDL", "VLDL", "LDLCALC", "LDLDIRECT", "LABVLDL"   RADIOLOGY: No results found.   Additional studies/ records that were reviewed today include:  I reviewed the  records from [place] [facility] in [place].  2015 records from Dr. Darlin Coco were reviewed  Downloads were obtained from March 24 through November 10, 2021; April 11 to Nov 28, 2021; in May 1 through Nov 29, 2021.   09/21/2021 CLINICAL INFORMATION Sleep Study Type: HST   Indication for sleep study: snoring, awakening gasping for breath, nocturia, inability to sleep on back, non-restorative sleep   Epworth Sleepiness Score: 7   SLEEP STUDY TECHNIQUE A multi-channel overnight portable sleep study was performed. The channels recorded were: nasal airflow, thoracic respiratory movement, and oxygen saturation with a pulse oximetry. Snoring was also monitored.   MEDICATIONS amLODipine (NORVASC) 5 MG tablet aspirin EC 81 MG EC tablet atorvastatin (LIPITOR) 10 MG tablet celecoxib (CELEBREX) 200 MG capsule gabapentin (NEURONTIN) 300 MG capsule metoprolol succinate (TOPROL-XL) 25 MG 24 hr tablet olmesartan (BENICAR) 40 MG tablet omeprazole (PRILOSEC) 10 MG capsule OZEMPIC, 1 MG/DOSE, 4 MG/3ML SOPN PARoxetine (PAXIL) 40 MG tablet  Patient self administered medications include: N/A.   SLEEP ARCHITECTURE Patient was studied for 375 minutes. The sleep efficiency was  100.0 % and the patient was supine for 99.5%. The arousal index was 0.0 per hour.   RESPIRATORY PARAMETERS The overall AHI was 90.9 per hour, with a central apnea index of 0 per hour.   The oxygen nadir was 65% during sleep.   CARDIAC DATA Mean heart rate during sleep was 77.6 bpm.   IMPRESSIONS - Very severe obstructive sleep apnea occurred during this study (AHI 90.9/h). - Severe oxygen desaturation to a nadir of 65%. Time spent < 89% was 182 minutes. - Patient snored for 95.3 minutes (25.4%) during the sleep.   DIAGNOSIS - Obstructive Sleep Apnea (G47.33) - Nocturnal Hypoxemia (G47.36)   RECOMMENDATIONS - In this patient with very severe sleep apnea recommend an expeditious in-lab CPAP/BiPAP titration study. If unable to obtain an in- lab evaluation, initiate Auto-PAP with EPR of 3 at 8 - 20 cm of water. - Effort should be made to optimize nasal and oropharyngeal patency. - Positional therapy avoiding supine position during sleep. - Avoid alcohol, sedatives and other CNS depressants that may worsen sleep apnea and disrupt normal sleep architecture. - Sleep hygiene should be reviewed to assess factors that may improve sleep quality. - Weight management (BMI 38) and regular exercise should be initiated or continued. - Return to Sleep Center to discuss the results of this study    ASSESSMENT:    1. Obstructive sleep apnea (adult) (pediatric)   2. Hypoxemia associated with sleep   3. Primary hypertension   4. Class 2 severe obesity due to excess calories with serious comorbidity and body mass index (BMI) of 37.0 to 37.9 in adult ([place])   5. Hyperlipidemia, unspecified hyperlipidemia type      PLAN:  [place] is a very pleasant 57 year old Hispanic male with family originating in [place].  He was born in [place].  In 2015, he had an episode of paroxysmal atrial fibrillation and he was pharmacologically cardioverted with IV diltiazem.  An echo Doppler study  at that time showed an EF of 55 to 60%.  At the time he was started on Toprol-XL.  Although he moved to [place] 10 years ago from [place], he still goes to [place] for his primary care.  He was placed on Ozempic for obesity and he has a history of hypertension and is on amlodipine 5 mg, metoprolol succinate 25 mg, and olmesartan 40 mg daily.  He also has a history of anxiety for which he takes paroxetine 40 mg.  He takes gabapentin 300 mg for peripheral neuropathy with possible restless legs.  He has been on low-dose atorvastatin for hyperlipidemia.  At my initial evaluation, he described a long history of not sleeping well.   He was symptomatic with loud snoring, awakening gasping for breath, his sleep was nonrestorative, was experiencing nocturia approximately 3 times per night, and was unable to sleep on his back.  I reviewed with him his home sleep study which was notable for demonstrating very severe sleep apnea with an AHI of 90.9/h.  He had severe oxygen desaturation to a nadir of 65% and spent over 3 hours less than 89%.  I recommended he undergo an expeditious in lab CPAP with potential need for BiPAP titration due to the severity of symptoms hematology and his cardiovascular risk factors. An in- lab titration was denied and he has subsequently been started on AutoPap therapy.  His initial pressure settings was a range of 8 to 20 cm of water which was subsequently increased on Nov 19, 2021 to 13 to 20 cm of water.  When I saw him in follow-up in May 2024, I further increased CPAP settings to a range of 17 to 20 cm of water.  I discussed if he still had elevated settings future BiPAP may be indicated.  Since I last saw him, he has done remarkably well and feels much better with the 17-20 pressure range.  His download today shows an AHI of 1.8 and his 95th percentile pressure is 19 with maximum average pressure 19.4.  Sleep duration although compliant is still suboptimal at only 5 hours and 33 minutes.  I  again discussed optimal sleep duration at 7 and 9 hours.  He is unaware of any breakthrough snoring, his sleep is restorative, and on therapy he had significant improvement in previous daytime sleepiness with his Epworth scale today now calculating at 4.  I discussed the importance today of weight loss and exercise.  He has lost 16 pounds over the last 3 months but BMI continues to be consistent with class II obesity at 37.66.  His blood pressure today is stable on his current regimen of olmesartan 40 mg, metoprolol succinate 25 mg and amlodipine 5 mg.  ECG is stable demonstrating sinus rhythm at 77 without ectopy.  He continues to be on Ozempic for weight loss, gabapentin for neuropathy, atorvastatin for hyperlipidemia and Truvada for prevention.  I will see him in 1 year for reevaluation or sooner as needed.   Medication Adjustments/Labs and Tests Ordered: Current medicines are reviewed at length with the patient today.  Concerns regarding medicines are outlined above.  Medication changes, Labs and Tests ordered today are listed in the Patient Instructions below. Patient Instructions  Medication Instructions:  The current medical regimen is effective;  continue present plan and medications.  *If you need a refill on your cardiac medications before your next appointment, please call your pharmacy*   Follow-Up: At [facility], you and your health needs are our priority.  As part of our continuing mission to provide you with exceptional heart care, we have created designated Provider Care Teams.  These Care Teams include your primary Cardiologist (physician) and Advanced Practice Providers (APPs -  Physician Assistants and Nurse Practitioners) who all work together to provide you with the care you need, when you need it.  We recommend signing up for the patient portal called "MyChart".  Sign  up information is provided on this After Visit Summary.  MyChart is used to connect with patients for  Virtual Visits (Telemedicine).  Patients are able to view lab/test results, encounter notes, upcoming appointments, etc.  Non-urgent messages can be sent to your provider as well.   To learn more about what you can do with MyChart, go to [email or web address].    Your next appointment:   12 month(s)  Provider:   Nicki Guadalajara, MD        Signed, Nicki Guadalajara, MD  08/27/2022 4:26 PM    [facility] [address], [address], [place], [place]  [address] Phone: [phone]

## 2022-08-31 ENCOUNTER — Encounter (HOSPITAL_BASED_OUTPATIENT_CLINIC_OR_DEPARTMENT_OTHER): Payer: Self-pay | Admitting: Emergency Medicine

## 2022-08-31 ENCOUNTER — Other Ambulatory Visit: Payer: Self-pay

## 2022-08-31 ENCOUNTER — Emergency Department (HOSPITAL_BASED_OUTPATIENT_CLINIC_OR_DEPARTMENT_OTHER)
Admission: EM | Admit: 2022-08-31 | Discharge: 2022-08-31 | Disposition: A | Discharge status: Home / Self Care | Payer: Commercial Managed Care - PPO | Attending: Emergency Medicine | Admitting: Emergency Medicine

## 2022-08-31 ENCOUNTER — Other Ambulatory Visit (HOSPITAL_BASED_OUTPATIENT_CLINIC_OR_DEPARTMENT_OTHER): Payer: Self-pay

## 2022-08-31 ENCOUNTER — Emergency Department (HOSPITAL_BASED_OUTPATIENT_CLINIC_OR_DEPARTMENT_OTHER): Payer: Commercial Managed Care - PPO

## 2022-08-31 DIAGNOSIS — F17210 Nicotine dependence, cigarettes, uncomplicated: Secondary | ICD-10-CM | POA: Insufficient documentation

## 2022-08-31 DIAGNOSIS — S99912A Unspecified injury of left ankle, initial encounter: Secondary | ICD-10-CM | POA: Diagnosis not present

## 2022-08-31 DIAGNOSIS — X501XXA Overexertion from prolonged static or awkward postures, initial encounter: Secondary | ICD-10-CM | POA: Diagnosis not present

## 2022-08-31 DIAGNOSIS — G2581 Restless legs syndrome: Secondary | ICD-10-CM | POA: Diagnosis not present

## 2022-08-31 DIAGNOSIS — Z794 Long term (current) use of insulin: Secondary | ICD-10-CM | POA: Insufficient documentation

## 2022-08-31 DIAGNOSIS — M7989 Other specified soft tissue disorders: Secondary | ICD-10-CM | POA: Insufficient documentation

## 2022-08-31 HISTORY — DX: Essential (primary) hypertension: I10

## 2022-08-31 LAB — CBC WITH DIFFERENTIAL/PLATELET
Abs Immature Granulocytes: 0.01 10*3/uL (ref 0.00–0.07)
Basophils Absolute: 0 10*3/uL (ref 0.0–0.1)
Basophils Relative: 1 %
Eosinophils Absolute: 0.2 10*3/uL (ref 0.0–0.5)
Eosinophils Relative: 2 %
HCT: 42.5 % (ref 39.0–52.0)
Hemoglobin: 14.8 g/dL (ref 13.0–17.0)
Immature Granulocytes: 0 %
Lymphocytes Relative: 23 %
Lymphs Abs: 2 10*3/uL (ref 0.7–4.0)
MCH: 31.8 pg (ref 26.0–34.0)
MCHC: 34.8 g/dL (ref 30.0–36.0)
MCV: 91.2 fL (ref 80.0–100.0)
Monocytes Absolute: 0.6 10*3/uL (ref 0.1–1.0)
Monocytes Relative: 7 %
Neutro Abs: 6 10*3/uL (ref 1.7–7.7)
Neutrophils Relative %: 67 %
Platelets: 219 10*3/uL (ref 150–400)
RBC: 4.66 MIL/uL (ref 4.22–5.81)
RDW: 13.5 % (ref 11.5–15.5)
WBC: 8.8 10*3/uL (ref 4.0–10.5)
nRBC: 0 % (ref 0.0–0.2)

## 2022-08-31 LAB — URIC ACID: Uric Acid, Serum: 7.1 mg/dL (ref 3.7–8.6)

## 2022-08-31 LAB — COMPREHENSIVE METABOLIC PANEL
ALT: 34 U/L (ref 0–44)
AST: 17 U/L (ref 15–41)
Albumin: 4.6 g/dL (ref 3.5–5.0)
Alkaline Phosphatase: 60 U/L (ref 38–126)
Anion gap: 10 (ref 5–15)
BUN: 18 mg/dL (ref 6–20)
CO2: 25 mmol/L (ref 22–32)
Calcium: 9.4 mg/dL (ref 8.9–10.3)
Chloride: 102 mmol/L (ref 98–111)
Creatinine, Ser: 1.09 mg/dL (ref 0.61–1.24)
GFR, Estimated: >60 mL/min (ref >60)
Glucose, Bld: 98 mg/dL (ref 70–99)
Potassium: 4.1 mmol/L (ref 3.5–5.1)
Sodium: 137 mmol/L (ref 135–145)
Total Bilirubin: 0.7 mg/dL (ref 0.3–1.2)
Total Protein: 7.3 g/dL (ref 6.5–8.1)

## 2022-08-31 MED ORDER — ACETAMINOPHEN 500 MG PO TABS
1000.0000 mg | ORAL_TABLET | Freq: Once | ORAL | Status: AC
  Administered 2022-08-31: 1000 mg via ORAL
  Filled 2022-08-31: qty 2

## 2022-08-31 MED ORDER — OXYCODONE HCL 5 MG PO TABS
5.0000 mg | ORAL_TABLET | Freq: Four times a day (QID) | ORAL | 0 refills | Status: DC | PRN
  Filled 2022-08-31: qty 6, 2d supply, fill #0

## 2022-08-31 NOTE — ED Provider Notes (Signed)
[facility] Provider Note  CSN: [id number] Arrival date & time: 08/31/22  [id number]  History  Chief Complaint  Patient presents with   Foot Pain   Samuel Patel is a 57 y.o. male.  Mr. Hlavaty is a 57 year old man who presents today with nontraumatic acute onset left ankle pain since last night.  He reports no known injury, however started having intensely sore left ankle and upper foot yesterday.  Pain worsened overnight and today he cannot walk on it, thus prompting him to come into the ED for evaluation.  No known injury.  No known history of osteopenia or porosis.  No medications that would put him at risk for fracture.  He endorses ankle swelling and pain.  No fever or chills, denies warmth or redness of the ankle.   PMH includes A-fib with RVR (8 years ago, controlled now), HLD, RLS, anxiety, tobacco use (3 to 4 cigarettes/day).  Medications reviewed.   Home Medications Prior to Admission medications   Medication Sig Start Date End Date Taking? Authorizing Provider  oxyCODONE (OXY IR/ROXICODONE) 5 MG immediate release tablet Take 1 tablet (5 mg total) by mouth every 6 (six) hours as needed for severe pain. 08/31/22  Yes Ezequiel Essex, MD  amLODipine (NORVASC) 5 MG tablet Take 5 mg by mouth daily. 06/08/21   [provider]  atorvastatin (LIPITOR) 10 MG tablet Take 10 mg by mouth daily.    [provider]  celecoxib (CELEBREX) 200 MG capsule Take 200 mg by mouth daily. 05/17/21   [provider]  emtricitabine-tenofovir (TRUVADA) 200-300 MG tablet Take 1 tablet by mouth daily. 07/31/22   [provider]  gabapentin (NEURONTIN) 100 MG capsule Take 100 mg by mouth 3 (three) times daily as needed. 07/31/22   [provider]  metoprolol succinate (TOPROL-XL) 25 MG 24 hr tablet Take 1 tablet (25 mg total) by mouth daily. 07/12/22   Troy Sine, MD  olmesartan (BENICAR) 40 MG tablet Take 40 mg by mouth  daily. 06/07/21   [provider]  omeprazole (PRILOSEC) 10 MG capsule Take 10 mg by mouth as needed.    [provider]  OZEMPIC, 2 MG/DOSE, 8 MG/3ML SOPN Inject 2 mg into the skin once a week. 09/28/21   [provider]  PARoxetine (PAXIL) 40 MG tablet Take 40 mg by mouth every morning.    [provider]     Allergies    Patient has no known allergies.    Review of Systems   Review of Systems  Constitutional:  Negative for chills, fatigue and fever.  Musculoskeletal:  Positive for gait problem and joint swelling. Negative for arthralgias and myalgias.  Skin:  Negative for color change, rash and wound.   Physical Exam Updated Vital Signs BP (!) 126/98   Pulse 96   Temp 98.2 F (36.8 C) (Oral)   Resp 18   Ht 5' 11.5" (1.816 m)   Wt 124.7 kg   SpO2 99%   BMI 37.82 kg/m  Physical Exam Vitals and nursing note reviewed.  Constitutional:      General: He is not in acute distress.    Appearance: Normal appearance. He is normal weight. He is not ill-appearing, toxic-appearing or diaphoretic.  HENT:     Head: Normocephalic and atraumatic.  Musculoskeletal:     Right lower leg: No swelling. No edema.     Left lower leg: No swelling. No edema.     Right ankle: Normal.  Right Achilles Tendon: Normal.     Left ankle: Swelling present. No deformity, ecchymosis or lacerations. Tenderness present over the lateral malleolus and medial malleolus. No CF ligament, posterior TF ligament, base of 5th metatarsal or proximal fibula tenderness. Decreased range of motion. Anterior drawer test negative.     Left Achilles Tendon: Normal.     Right foot: Normal.     Left foot: No swelling, deformity, laceration, tenderness, bony tenderness or crepitus.  Skin:    Capillary Refill: Capillary refill takes less than 2 seconds.  Neurological:     General: No focal deficit present.     Mental Status: He is alert and oriented to person, place, and time. Mental status  is at baseline.     Cranial Nerves: No cranial nerve deficit.     Sensory: No sensory deficit.     Motor: No weakness.     Gait: Gait abnormal.  Psychiatric:        Mood and Affect: Mood normal.        Behavior: Behavior normal.   ED Results / Procedures / Treatments   Labs (all labs ordered are listed, but only abnormal results are displayed) Labs Reviewed  CBC WITH DIFFERENTIAL/PLATELET  COMPREHENSIVE METABOLIC PANEL  URIC ACID   EKG None  Radiology DG Ankle Complete Left  Result Date: 08/31/2022 CLINICAL DATA:  57 year old male with history of left foot and ankle pain. No history of injury. EXAM: LEFT FOOT - COMPLETE 3+ VIEW; LEFT ANKLE COMPLETE - 3+ VIEW COMPARISON:  No priors. FINDINGS: There is no evidence of fracture or dislocation. There is no evidence of arthropathy or other focal bone abnormality. Soft tissues are unremarkable. IMPRESSION: Negative. Electronically Signed   By: Vinnie Langton M.D.   On: 08/31/2022 08:40   DG Foot Complete Left  Result Date: 08/31/2022 CLINICAL DATA:  57 year old male with history of left foot and ankle pain. No history of injury. EXAM: LEFT FOOT - COMPLETE 3+ VIEW; LEFT ANKLE COMPLETE - 3+ VIEW COMPARISON:  No priors. FINDINGS: There is no evidence of fracture or dislocation. There is no evidence of arthropathy or other focal bone abnormality. Soft tissues are unremarkable. IMPRESSION: Negative. Electronically Signed   By: Vinnie Langton M.D.   On: 08/31/2022 08:40    Procedures Procedures   Medications Ordered in ED Medications  acetaminophen (TYLENOL) tablet 1,000 mg (1,000 mg Oral Given 08/31/22 0836)   ED Course/ Medical Decision Making/ A&P  Medical Decision Making Previously well 57 yo male with nontraumatic acute onset left ankle pain, inferior to both medial and lateral malleolus.  Started last night, worsened overnight and is now unable to walk on it.  Differential is broad but does include ankle fracture, ligamentous  injury, and atypical gout.  Will order complete x-rays of left ankle and foot (ensuring mortise view of ankle to rule out unstable ankle joint), CBC, CMP, and uric acid.  Tylenol 1000 mg and ice for pain relief.  Complete x-rays of left ankle and foot read as negative for both.  No evidence of unstable ankle joint, fracture, or other injury.  Labs globally unremarkable, uric acid normal.  Tylenol with only mild relief.  Will place in cam boot and have follow-up with orthopedics within 3 to 7 days.  Recommend Tylenol, ibuprofen, and icing.  Rx Oxy IR 5 mg for severe pain (tramadol would have interacted with Paxil).  Return precautions given, see AVS for more.  Amount and/or Complexity of Data Reviewed Labs: ordered.  Details: CBC, CMP, uric acid Radiology: ordered.    Details: Complete x-rays of left ankle and foot  Risk OTC drugs. Prescription drug management.   Final Clinical Impression(s) / ED Diagnoses Final diagnoses:  Ankle injury, left, initial encounter   Rx / DC Orders ED Discharge Orders          Ordered    oxyCODONE (OXY IR/ROXICODONE) 5 MG immediate release tablet  Every 6 hours PRN        08/31/22 0925           Ezequiel Essex, MD   Ezequiel Essex, MD 08/31/22 [place], Gwenyth Allegra, MD 10/10/22 [phone]

## 2022-08-31 NOTE — ED Triage Notes (Signed)
Pt arrives pov, to triage in wheelchair c/o LT foot pain since last night. Pt denies injury, difficulty ambulating.

## 2022-08-31 NOTE — ED Notes (Signed)
ED Provider at bedside. 

## 2022-08-31 NOTE — Discharge Instructions (Addendum)
Today your ankle and foot x-ray did not show any abnormalities including any fractures or dislocations.  Your lab work was normal. It did not show evidence of gout (and your story and exam doesn't look like gout either).   Sometimes fractures can take weeks to show up on X-ray. Because of this, we put you in a boot.  Please wear the boot on your ankle during, taking off only to go to bed after showering.   **Please follow-up with orthopedics, we have included the information for [facility] in your paperwork.**  You can take tylenol and ibuprofen alternating for pain relief and swelling reduction. Please ice the area for 10-15 minutes at a time up to 3 times daily.   I have written you prescription for a small amount of oxycodone immediate release 5 mg tablets.  Use this for severe pain that is not responding to the ibuprofen, Tylenol, and icing.

## 2022-08-31 NOTE — ED Notes (Signed)
Pt discharged to home. Discharge instructions have been discussed with patient and/or family members. Pt verbally acknowledges understanding d/c instructions, and endorses comprehension to checkout at registration before leaving.  °

## 2022-08-31 NOTE — ED Notes (Signed)
Pt declined offer of crutches

## 2022-12-16 ENCOUNTER — Other Ambulatory Visit: Payer: Self-pay | Admitting: Cardiovascular Disease

## 2023-07-05 ENCOUNTER — Other Ambulatory Visit: Payer: Self-pay | Admitting: Cardiovascular Disease

## 2023-10-14 ENCOUNTER — Other Ambulatory Visit: Payer: Self-pay | Admitting: Cardiovascular Disease

## 2023-11-11 ENCOUNTER — Other Ambulatory Visit: Payer: Self-pay

## 2023-11-11 MED ORDER — METOPROLOL SUCCINATE ER 25 MG PO TB24: 25.0000 mg | ORAL_TABLET | Freq: Every day | ORAL | 0 refills | Status: DC

## 2023-11-26 ENCOUNTER — Ambulatory Visit
Admission: RE | Admit: 2023-11-26 | Discharge: 2023-11-26 | Disposition: A | Discharge status: Home / Self Care | Source: Ambulatory Visit | Attending: Family Medicine | Admitting: Family Medicine

## 2023-11-26 ENCOUNTER — Other Ambulatory Visit: Payer: Self-pay

## 2023-11-26 ENCOUNTER — Ambulatory Visit (INDEPENDENT_AMBULATORY_CARE_PROVIDER_SITE_OTHER)

## 2023-11-26 VITALS — BP 134/89 | HR 76 | Temp 98.0°F | Resp 17

## 2023-11-26 DIAGNOSIS — M79672 Pain in left foot: Secondary | ICD-10-CM

## 2023-11-26 DIAGNOSIS — M7989 Other specified soft tissue disorders: Secondary | ICD-10-CM

## 2023-11-26 HISTORY — DX: Obstructive sleep apnea (adult) (pediatric): G47.33

## 2023-11-26 HISTORY — DX: Unspecified atrial fibrillation: I48.91

## 2023-11-26 NOTE — ED Triage Notes (Signed)
 Pt c/o left foot painx3wks. Pt denies injury. Pt has 2+ swelling of left foot. Pt has 2+ left pedal pulse, cap refill less than 3 sec, warm to touch. Pt limped to exam room. Pt states the right foot is starting to hurt also.

## 2023-11-26 NOTE — Discharge Instructions (Signed)
 Please go to the emergency room for further evaluation of your symptoms

## 2023-11-26 NOTE — ED Provider Notes (Signed)
 [facility]    CSN: [id number] Arrival date & time: 11/26/23  1625      History   Chief Complaint Chief Complaint  Patient presents with   Foot Pain    Entered by patient    HPI Geon Claude is a 58 y.o. male presents for foot pain/swelling.  Patient reports 3 weeks ago he began developing pain to the dorsum of his left foot that has persisted.  1 week ago he developed swelling of the foot that is gradually increasing into his ankle.  He states his foot and ankle feel tight.  No numbness tingling, redness or warmth.  No known injury or inciting event.  No history of gout.  Denies history of PVD or PAD, CHF.  No new medications.  Cannot identify any alleviating factors of his symptoms and reports that swelling does not improve after elevation.  No other concerns at this time.   Foot Pain    Past Medical History:  Diagnosis Date   Anxiety    Atrial fibrillation (HCC)    Elevated cholesterol    Hypertension    OSA (obstructive sleep apnea)     Patient Active Problem List   Diagnosis Date Noted   RLS (restless legs syndrome) 08/31/2022   Tobacco abuse 05/03/2014   Anxiety 05/03/2014   HLD (hyperlipidemia) 05/03/2014   Obesity (BMI 35.0-39.9 without comorbidity) 05/03/2014   Caffeine use 05/03/2014   Atrial fibrillation with rapid ventricular response (HCC) 05/02/2014   Atrial fibrillation (HCC) 05/02/2014    Past Surgical History:  Procedure Laterality Date   APPENDECTOMY     CHOLECYSTECTOMY         Home Medications    Prior to Admission medications   Medication Sig Start Date End Date Taking? Authorizing Provider  amLODipine (NORVASC) 5 MG tablet Take 5 mg by mouth daily. 06/08/21   [provider]  atorvastatin  (LIPITOR) 10 MG tablet Take 10 mg by mouth daily.    [provider]  celecoxib (CELEBREX) 200 MG capsule Take 200 mg by mouth daily. 05/17/21   [provider]  emtricitabine-tenofovir (TRUVADA) 200-300 MG tablet  Take 1 tablet by mouth daily. 07/31/22   [provider]  gabapentin (NEURONTIN) 100 MG capsule Take 100 mg by mouth 3 (three) times daily as needed. 07/31/22   [provider]  metoprolol  succinate (TOPROL -XL) 25 MG 24 hr tablet Take 1 tablet (25 mg total) by mouth daily. 11/11/23   Millicent Ally, MD  olmesartan (BENICAR) 40 MG tablet Take 40 mg by mouth daily. 06/07/21   [provider]  omeprazole (PRILOSEC) 10 MG capsule Take 10 mg by mouth as needed.    [provider]  oxyCODONE  (OXY IR/ROXICODONE ) 5 MG immediate release tablet Take 1 tablet (5 mg total) by mouth every 6 (six) hours as needed for severe pain. 08/31/22   Santana Cue, MD  OZEMPIC, 2 MG/DOSE, 8 MG/3ML SOPN Inject 2 mg into the skin once a week. 09/28/21   [provider]  PARoxetine  (PAXIL ) 40 MG tablet Take 40 mg by mouth every morning.    [provider]    Family History Family History  Problem Relation Age of Onset   Diabetes Mother     Social History Social History   Tobacco Use   Smoking status: Every Day    Types: Cigarettes   Smokeless tobacco: Never  Substance Use Topics   Alcohol use: Yes    Comment: occassionally   Drug use:  No     Allergies   Penicillins   Review of Systems Review of Systems  Musculoskeletal:        Left foot pain and swelling     Physical Exam Triage Vital Signs ED Triage Vitals  Encounter Vitals Group     BP 11/26/23 1722 134/89     Systolic BP Percentile --      Diastolic BP Percentile --      Pulse Rate 11/26/23 1722 76     Resp 11/26/23 1722 17     Temp 11/26/23 1722 98 F (36.7 C)     Temp Source 11/26/23 1722 Oral     SpO2 11/26/23 1722 97 %     Weight --      Height --      Head Circumference --      Peak Flow --      Pain Score 11/26/23 1718 9     Pain Loc --      Pain Education --      Exclude from Growth Chart --    No data found.  Updated Vital Signs BP 134/89   Pulse 76   Temp 98 F  (36.7 C) (Oral)   Resp 17   SpO2 97%   Visual Acuity Right Eye Distance:   Left Eye Distance:   Bilateral Distance:    Right Eye Near:   Left Eye Near:    Bilateral Near:     Physical Exam Vitals and nursing note reviewed.  Constitutional:      General: He is not in acute distress.    Appearance: Normal appearance. He is not ill-appearing.  HENT:     Head: Normocephalic and atraumatic.  Eyes:     Pupils: Pupils are equal, round, and reactive to light.  Cardiovascular:     Rate and Rhythm: Normal rate.  Pulmonary:     Effort: Pulmonary effort is normal.  Musculoskeletal:       Feet:  Feet:     Comments: There is moderate swelling of the left foot with +1 pitting edema to the ankle.  Tender to palpation to the entire dorsum of the left foot without erythema, warmth, rashes.  Skins intact.  DP +2.  No pain with dorsiflexion or extension. Skin:    General: Skin is warm and dry.  Neurological:     General: No focal deficit present.     Mental Status: He is alert and oriented to person, place, and time.  Psychiatric:        Mood and Affect: Mood normal.        Behavior: Behavior normal.      UC Treatments / Results  Labs (all labs ordered are listed, but only abnormal results are displayed) Labs Reviewed - No data to display  EKG   Radiology DG Foot Complete Left Result Date: 11/26/2023 CLINICAL DATA:  Left foot pain for 3 weeks. EXAM: LEFT FOOT - COMPLETE 3+ VIEW COMPARISON:  August 31, 2022. FINDINGS: There is no evidence of fracture or dislocation. There is no evidence of arthropathy or other focal bone abnormality. Soft tissues are unremarkable. IMPRESSION: Negative. Electronically Signed   By: Rosalene Colon M.D.   On: 11/26/2023 17:53    Procedures Procedures (including critical care time)  Medications Ordered in UC Medications - No data to display  Initial Impression / Assessment and Plan / UC Course  I have reviewed the triage vital signs and the  nursing notes.  Pertinent labs &  imaging results that were available during my care of the patient were reviewed by me and considered in my medical decision making (see chart for details).     Reviewed exam and symptoms with patient.  Discussed limitations and abilities of urgent care.  Patient with worsening left foot pain with onset of swelling x 1 week.  X-rays negative.  Unclear cause but discussed multiple causes of unilateral swelling.  Discussed ER evaluation but patient declines due to cost concern and will contact his PCP tomorrow for further workup.  Did instruct him to go to the ER if he develops any worsening symptoms report is able to be seen by his PCP, red flags reviewed and patient verbalized understanding. Final Clinical Impressions(s) / UC Diagnoses   Final diagnoses:  Left foot pain  Swelling of left foot     Discharge Instructions      Please go to the emergency room for further evaluation of your symptoms     ED Prescriptions   None    PDMP not reviewed this encounter.   Alleen Arbour, NP 11/26/23 1807

## 2024-01-08 ENCOUNTER — Telehealth: Payer: Self-pay | Admitting: Internal Medicine

## 2024-01-08 NOTE — Telephone Encounter (Signed)
 Unfortunately I am not accepting nonfamily members at this time

## 2024-01-08 NOTE — Telephone Encounter (Signed)
 Copied from CRM [id number]. Topic: Appointments - Scheduling Inquiry for Clinic >> Jan 07, 2024  3:10 PM Samuel Patel wrote: Reason for CRM: Patient would like to see if Samuel Patel will take him on as a new patient since he was referred by his friend Samuel Patel, who is a  current patient of Samuel Patel - Please call patient to let him know at  [phone] (M)

## 2024-01-14 ENCOUNTER — Other Ambulatory Visit: Payer: Self-pay | Admitting: Cardiovascular Disease

## 2024-01-14 ENCOUNTER — Telehealth: Payer: Self-pay | Admitting: Cardiovascular Disease

## 2024-01-14 NOTE — Telephone Encounter (Signed)
*  STAT* If patient is at the pharmacy, call can be transferred to refill team.   1. Which medications need to be refilled? (please list name of each medication and dose if known)   metoprolol  succinate (TOPROL -XL) 25 MG 24 hr tablet   2. Would you like to learn more about the convenience, safety, & potential cost savings by using the [facility]?   3. Are you open to using the [facility] (Type [facility]. ).  4. Which pharmacy/location (including street and city if local pharmacy) is medication to be sent to?  [id number] - [place], [place] - [address]   5. Do they need a 30 day or 90 day supply?   90 day  Patient stated he is completely out of this medication. Patient has appointment scheduled on 8/6 with HILARIO Ford, PA.

## 2024-01-16 NOTE — Telephone Encounter (Signed)
 Pt c/o medication issue:  1. Name of Medication:   metoprolol  succinate (TOPROL -XL) 25 MG 24 hr tablet   2. How are you currently taking this medication (dosage and times per day)?     3. Are you having a reaction (difficulty breathing--STAT)?   4. What is your medication issue?   Patient stated his insurance company will not cover a 30 day supply but will cover a 90 day supply. Patient has appointment scheduled on 8/6 with HILARIO Ford, PA.  Patient wants a prescription sent to [id number] - [place], [place] - [address]  Patient noted he is completely out of this medication.

## 2024-01-16 NOTE — Telephone Encounter (Signed)
 Patient needs refill on metoprolol  with 90 day supply . Will see if Scot Ford PA, who sees patient in August will refill, since Dr. Burnard has retired.

## 2024-01-30 ENCOUNTER — Other Ambulatory Visit (HOSPITAL_COMMUNITY): Payer: Self-pay

## 2024-01-30 MED ORDER — METOPROLOL SUCCINATE ER 25 MG PO TB24
25.0000 mg | ORAL_TABLET | Freq: Every day | ORAL | 1 refills | Status: DC
  Filled 2024-01-30: qty 30, 30d supply, fill #0

## 2024-01-30 NOTE — Telephone Encounter (Signed)
 That's fine with me to make sure Samuel Patel has enough medication until follow up.

## 2024-02-25 ENCOUNTER — Ambulatory Visit: Attending: Physician Assistant | Admitting: Physician Assistant

## 2024-02-25 ENCOUNTER — Encounter: Payer: Self-pay | Admitting: Physician Assistant

## 2024-02-25 ENCOUNTER — Ambulatory Visit: Attending: Physician Assistant

## 2024-02-25 ENCOUNTER — Other Ambulatory Visit: Payer: Self-pay | Admitting: Physician Assistant

## 2024-02-25 VITALS — BP 104/66 | HR 71 | Ht 71.5 in | Wt 285.3 lb

## 2024-02-25 DIAGNOSIS — R002 Palpitations: Secondary | ICD-10-CM | POA: Diagnosis not present

## 2024-02-25 DIAGNOSIS — I10 Essential (primary) hypertension: Secondary | ICD-10-CM

## 2024-02-25 DIAGNOSIS — G4733 Obstructive sleep apnea (adult) (pediatric): Secondary | ICD-10-CM | POA: Diagnosis not present

## 2024-02-25 MED ORDER — METOPROLOL SUCCINATE ER 25 MG PO TB24: 25.0000 mg | ORAL_TABLET | Freq: Every day | ORAL | 3 refills | Status: AC

## 2024-02-25 NOTE — Patient Instructions (Signed)
 Medication Instructions:  NO CHANGES *If you need a refill on your cardiac medications before your next appointment, please call your pharmacy*  Lab Work: NO LABS If you have labs (blood work) drawn today and your tests are completely normal, you will receive your results only by: MyChart Message (if you have MyChart) OR A paper copy in the mail If you have any lab test that is abnormal or we need to change your treatment, we will call you to review the results.  Testing/Procedures: GEOFFRY HEWS- Long Term Monitor Instructions  Your physician has requested you wear a ZIO patch monitor for 14 days.  This is a single patch monitor. [employer] supplies one patch monitor per enrollment. Additional stickers are not available. Please do not apply patch if you will be having a Nuclear Stress Test,  Echocardiogram, Cardiac CT, MRI, or Chest Xray during the period you would be wearing the  monitor. The patch cannot be worn during these tests. You cannot remove and re-apply the  ZIO XT patch monitor.  Your ZIO patch monitor will be mailed 3 day USPS to your address on file. It may take 3-5 days  to receive your monitor after you have been enrolled.  Once you have received your monitor, please review the enclosed instructions. Your monitor  has already been registered assigning a specific monitor serial # to you.  Billing and Patient Assistance Program Information  We have supplied [employer] with any of your insurance information on file for billing purposes. [employer] offers a sliding scale Patient Assistance Program for patients that do not have  insurance, or whose insurance does not completely cover the cost of the ZIO monitor.  You must apply for the Patient Assistance Program to qualify for this discounted rate.  To apply, please call [employer] at [phone], select option 4, select option 2, ask to apply for  Patient Assistance Program. Meredeth will ask your household income, and how many people  are  in your household. They will quote your out-of-pocket cost based on that information.  [employer] will also be able to set up a 60-month, interest-free payment plan if needed.  Applying the monitor   Shave hair from upper left chest.  Hold abrader disc by orange tab. Rub abrader in 40 strokes over the upper left chest as  indicated in your monitor instructions.  Clean area with 4 enclosed alcohol pads. Let dry.  Apply patch as indicated in monitor instructions. Patch will be placed under collarbone on left  side of chest with arrow pointing upward.  Rub patch adhesive wings for 2 minutes. Remove white label marked 1. Remove the white  label marked 2. Rub patch adhesive wings for 2 additional minutes.  While looking in a mirror, press and release button in center of patch. A small green light will  flash 3-4 times. This will be your only indicator that the monitor has been turned on.  Do not shower for the first 24 hours. You may shower after the first 24 hours.  Press the button if you feel a symptom. You will hear a small click. Record Date, Time and  Symptom in the Patient Logbook.  When you are ready to remove the patch, follow instructions on the last 2 pages of Patient  Logbook. Stick patch monitor onto the last page of Patient Logbook.  Place Patient Logbook in the blue and white box. Use locking tab on box and tape box closed  securely. The blue and white box has prepaid  postage on it. Please place it in the mailbox as  soon as possible. Your physician should have your test results approximately 7 days after the  monitor has been mailed back to [facility].  Call [facility] Customer Care at [phone] if you have questions regarding  your ZIO XT patch monitor. Call them immediately if you see an orange light blinking on your  monitor.  If your monitor falls off in less than 4 days, contact our Monitor department at [phone].  If your monitor becomes loose or falls  off after 4 days call [employer] at [phone] for  suggestions on securing your monitor   Follow-Up: At [facility], you and your health needs are our priority.  As part of our continuing mission to provide you with exceptional heart care, our providers are all part of one team.  This team includes your primary Cardiologist (physician) and Advanced Practice Providers or APPs (Physician Assistants and Nurse Practitioners) who all work together to provide you with the care you need, when you need it.  Your next appointment:   1 year(s)  Provider:   Wilbert Bihari, MD

## 2024-02-25 NOTE — Progress Notes (Unsigned)
Enrolled for [employer] to mail a ZIO XT long term holter monitor to the patients address on file.   Dr. Turner to read. 

## 2024-02-25 NOTE — Progress Notes (Signed)
 Cardiology Office Note   Date:  02/25/2024  ID:  Samuel Patel, DOB 1966-05-12, MRN [id number] PCP: Samuel Patel SAILOR, MD  [facility] Providers Cardiologist:  Wilbert Bihari, MD Sleep Medicine:  Debby Sor, MD (Inactive)     History of Present Illness Samuel Patel is a 58 y.o. male with past medical history of PAF, OSA, hypertension, peripheral neuropathy, hyperlipidemia and obesity.  He has been taking Ozempic for weight loss and the gabapentin for neuropathy.  He is on atorvastatin  for hyperlipidemia.  Patient was initially referred to cardiology service for management of obstructive sleep apnea.  In the past, he has also been seen by Dr. Dominick in 2015 for episode of A-fib that was converted on diltiazem  drip.  Chest Vascor at the time was 0, therefore he was not started on anticoagulation therapy.  He has not had any recurrence of A-fib since.  He was last seen by Dr. Sor in February 2024 at which time he was doing well.  Patient presents today for follow-up.  He has been compliant with CPAP therapy, he uses CPAP about 5 to 7 hours a night.  He denies any exertional chest pain or shortness of breath.  He has no lower extremity edema.  He does have occasional brief palpitation that last between 10 to 15 seconds each time and may occurs about 2-3 times per week.  I recommended 2-week heart monitor, if negative, patient can follow-up in 1 year.  I have messaged Dr. Bihari to see if she is willing to become the patient's primary cardiologist and sleep specialist as well  ROS:   Patient has occasional palpitation that lasted 10 to 15 seconds each and then occurs 2-3 times per week.  He denies any chest pain or worsening dyspnea  Studies Reviewed EKG Interpretation Date/Time:  Wednesday February 25 2024 13:21:17 EDT Ventricular Rate:  71 PR Interval:  166 QRS Duration:  92 QT Interval:  376 QTC Calculation: 408 R Axis:   52  Text Interpretation: Normal sinus rhythm Normal ECG When  compared with ECG of 21-May-2017 09:30, PREVIOUS ECG IS PRESENT Confirmed by Naliah Eddington [phone]) on 02/25/2024 1:54:23 PM     Risk Assessment/Calculations  CHA2DS2-VASc Score = 1   This indicates a 0.6% annual risk of stroke. The patient's score is based upon: CHF History: 0 HTN History: 1 Diabetes History: 0 Stroke History: 0 Vascular Disease History: 0 Age Score: 0 Gender Score: 0             Physical Exam VS:  BP 104/66   Pulse 71   Ht 5' 11.5 (1.816 m)   Wt 285 lb 4.8 oz (129.4 kg)   SpO2 95%   BMI 39.24 kg/m        Wt Readings from Last 3 Encounters:  02/25/24 285 lb 4.8 oz (129.4 kg)  08/31/22 275 lb (124.7 kg)  08/27/22 270 lb (122.5 kg)    GEN: Well nourished, well developed in no acute distress NECK: No JVD; No carotid bruits CARDIAC: RRR, no murmurs, rubs, gallops RESPIRATORY:  Clear to auscultation without rales, wheezing or rhonchi  ABDOMEN: Soft, non-tender, non-distended EXTREMITIES:  No edema; No deformity   ASSESSMENT AND PLAN  Palpitation: Patient had remote history of A-fib in 2015 converted on IV diltiazem .  He has no diagnosable A-fib since therefore was not placed on anticoagulation therapy.  He has very low CHA2DS2-Vasc score of 1.  Recently, he has occasional brief palpitation, I recommended 2-week heart monitor, as long  as he does not have any significant A-fib, he can follow-up here in 1 year.  Hypertension: Blood pressure well-controlled  OSA: He has been compliant with CPAP therapy.  I discussed his case with Dr. Shlomo who is willing to resume management of his OSA after Dr. Burnard has retired.        Dispo: Follow-up in 1 year  Signed, Lelania Bia, [place]

## 2024-03-09 ENCOUNTER — Other Ambulatory Visit: Payer: Self-pay | Admitting: Medical Genetics

## 2024-03-16 ENCOUNTER — Ambulatory Visit: Admitting: Internal Medicine

## 2024-03-16 ENCOUNTER — Other Ambulatory Visit: Payer: Self-pay

## 2024-03-16 ENCOUNTER — Other Ambulatory Visit (HOSPITAL_BASED_OUTPATIENT_CLINIC_OR_DEPARTMENT_OTHER): Payer: Self-pay

## 2024-03-16 ENCOUNTER — Telehealth: Payer: Self-pay

## 2024-03-16 ENCOUNTER — Encounter: Payer: Self-pay | Admitting: Internal Medicine

## 2024-03-16 VITALS — BP 130/82 | HR 73 | Temp 98.3°F | Ht 71.5 in | Wt 284.8 lb

## 2024-03-16 DIAGNOSIS — R7303 Prediabetes: Secondary | ICD-10-CM | POA: Diagnosis not present

## 2024-03-16 DIAGNOSIS — G4733 Obstructive sleep apnea (adult) (pediatric): Secondary | ICD-10-CM | POA: Insufficient documentation

## 2024-03-16 DIAGNOSIS — Z7251 High risk heterosexual behavior: Secondary | ICD-10-CM

## 2024-03-16 DIAGNOSIS — E785 Hyperlipidemia, unspecified: Secondary | ICD-10-CM

## 2024-03-16 DIAGNOSIS — M109 Gout, unspecified: Secondary | ICD-10-CM | POA: Insufficient documentation

## 2024-03-16 DIAGNOSIS — M10071 Idiopathic gout, right ankle and foot: Secondary | ICD-10-CM

## 2024-03-16 DIAGNOSIS — F419 Anxiety disorder, unspecified: Secondary | ICD-10-CM

## 2024-03-16 DIAGNOSIS — I10 Essential (primary) hypertension: Secondary | ICD-10-CM | POA: Insufficient documentation

## 2024-03-16 MED ORDER — YEZTUGO 463.5 MG/1.5ML ~~LOC~~ SOLN
927.0000 mg | SUBCUTANEOUS | 1 refills | Status: DC
  Filled 2024-03-16: qty 3, 180d supply, fill #0

## 2024-03-16 MED ORDER — TIRZEPATIDE 2.5 MG/0.5ML ~~LOC~~ SOAJ: 2.5000 mg | SUBCUTANEOUS | 11 refills | Status: DC

## 2024-03-16 MED ORDER — TIRZEPATIDE 2.5 MG/0.5ML ~~LOC~~ SOAJ
2.5000 mg | SUBCUTANEOUS | 11 refills | Status: DC
  Filled 2024-03-16: qty 2, 28d supply, fill #0

## 2024-03-16 MED ORDER — YEZTUGO 463.5 MG/1.5ML ~~LOC~~ SOLN: 927.0000 mg | SUBCUTANEOUS | 1 refills | Status: DC

## 2024-03-16 MED ORDER — PAROXETINE HCL 40 MG PO TABS
40.0000 mg | ORAL_TABLET | ORAL | 4 refills | Status: DC
Start: 2024-03-16 — End: 2024-05-19
  Filled 2024-03-16 – 2024-03-19 (×2): qty 30, 30d supply, fill #0
  Filled 2024-04-14 – 2024-05-11 (×2): qty 30, 30d supply, fill #1

## 2024-03-16 NOTE — Progress Notes (Signed)
 ==============================  [facility]: [phone]   -- Medical Office Visit --  Patient: Samuel Patel      Age: 58 y.o.       Sex:  male  Date:   03/16/2024 Today's Healthcare Provider: Bernardino KANDICE Cone, MD  ==============================   Chief Complaint: New pt  (Pt is present to est care with pcp concerns about weight was on ozempic for while then stop taking it only lost like 1 to 2 lbs was taking for A1c would like to try monjuro.)   Discussed the use of AI scribe software for clinical note transcription with the patient, who gave verbal consent to proceed.  History of Present Illness Samuel Patel is a 58 year old male who presents with concerns about weight management and medication adjustments.  He stopped taking Ozempic 3-4 months ago due to lack of efficacy in weight loss and is interested in switching to Mounjaro . His last recorded A1c was 5.6% in September 2024, and he has not been diagnosed with diabetes. He has been taking Ozempic for prediabetes and has not had prior issues with insurance coverage.  He has a history of sleep apnea and is currently under the care of Dr. Randine Bihari for sleep studies.  He has a history of anxiety and atrial fibrillation from 2015, but no recent records are available. He is currently taking amlodipine for blood pressure, atorvastatin  10 mg for cholesterol, and paroxetine  for anxiety.  He has a history of gout with one attack in the right big toe, but he is not on allopurinol. He also takes Xelprox for arthritis.  He is on Truvada for PrEP but is interested in switching to a longer-term option due to difficulties with daily adherence and some nausea and gastrointestinal side effects. He also experiences stigma associated with carrying Truvada.  He smokes and recently traveled to [place], where he used acetazolamide for altitude sickness and betamethasone cream, hydroxyzine, and triamcinolone for insect  bites and itching.  Background Reviewed: Problem List: has Atrial fibrillation (HCC); Tobacco abuse; Anxiety; Dyslipidemia; Obesity (BMI 35.0-39.9 without comorbidity); Caffeine use; RLS (restless legs syndrome); Gout; Prediabetes; OSA (obstructive sleep apnea); Primary hypertension; and High risk sexual behavior on their problem list. Past Medical History:  has a past medical history of Anxiety, Asthma, Atrial fibrillation (HCC), Atrial fibrillation with rapid ventricular response (HCC) (05/02/2014), Depression, Elevated cholesterol, Hypertension, OSA (obstructive sleep apnea), and Sleep apnea. Past Surgical History:   has a past surgical history that includes Appendectomy; Cholecystectomy; and Total shoulder replacement (Left). Social History:   reports that he has been smoking cigarettes. He started smoking about 7 months ago. He has a 0.2 pack-year smoking history. He has never used smokeless tobacco. He reports current alcohol use. He reports that he does not use drugs. Family History:  family history includes Asthma in his mother; Cancer in his father; Depression in his brother, daughter, and sister; Diabetes in his mother; Hyperlipidemia in his mother; Hypertension in his mother. Allergies:  is allergic to penicillins.   Medication Reconciliation: Current Outpatient Medications on File Prior to Visit  Medication Sig   amLODipine (NORVASC) 5 MG tablet Take 5 mg by mouth daily.   atorvastatin  (LIPITOR) 10 MG tablet Take 10 mg by mouth daily.   celecoxib (CELEBREX) 200 MG capsule Take 200 mg by mouth daily.   gabapentin (NEURONTIN) 100 MG capsule Take 100 mg by mouth 3 (three) times daily as needed.   metoprolol  succinate (TOPROL -XL) 25 MG  24 hr tablet Take 1 tablet (25 mg total) by mouth daily.   olmesartan (BENICAR) 40 MG tablet Take 40 mg by mouth daily.   omeprazole (PRILOSEC) 10 MG capsule Take 10 mg by mouth as needed.   emtricitabine-tenofovir (TRUVADA) 200-300 MG tablet Take 1 tablet  by mouth daily.   oxyCODONE  (OXY IR/ROXICODONE ) 5 MG immediate release tablet Take 1 tablet (5 mg total) by mouth every 6 (six) hours as needed for severe pain. (Patient not taking: Reported on 03/16/2024)   OZEMPIC, 2 MG/DOSE, 8 MG/3ML SOPN Inject 2 mg into the skin once a week. (Patient not taking: Reported on 03/16/2024)   No current facility-administered medications on file prior to visit.   Medications Discontinued During This Encounter  Medication Reason   PARoxetine  (PAXIL ) 40 MG tablet Reorder   tirzepatide  (MOUNJARO ) 2.5 MG/0.5ML Pen      Physical Exam:    03/16/2024    9:47 AM 02/25/2024    1:23 PM 11/26/2023    5:22 PM  Vitals with BMI  Height 5' 11.5 5' 11.5   Weight 284 lbs 13 oz 285 lbs 5 oz   BMI 39.17 39.24   Systolic 130 104 865  Diastolic 82 66 89  Pulse 73 71 76  Vital signs reviewed.  Nursing notes reviewed. Weight trend reviewed. Physical Activity: Not on file   General Appearance:  No acute distress appreciable.   Well-groomed, healthy-appearing male.  Well proportioned with no abnormal fat distribution.  Good muscle tone. Pulmonary:  Normal work of breathing at rest, no respiratory distress apparent. SpO2: 97 %  Musculoskeletal: All extremities are intact.  Neurological:  Awake, alert, oriented, and engaged.  No obvious focal neurological deficits or cognitive impairments.  Sensorium seems unclouded.   Speech is clear and coherent with logical content. Psychiatric:  Appropriate mood, pleasant and cooperative demeanor, thoughtful and engaged during the exam  Results:    03/16/2024    9:54 AM  PHQ 2/9 Scores  PHQ - 2 Score 0    Verbalized to patient: Results LABS A1c: 5.6 (03/2023) LDL: 56 (03/2023)   Admission on 08/31/2022, Discharged on 08/31/2022  Component Date Value Ref Range Status   WBC 08/31/2022 8.8  4.0 - 10.5 K/uL Final   RBC 08/31/2022 4.66  4.22 - 5.81 MIL/uL Final   Hemoglobin 08/31/2022 14.8  13.0 - 17.0 g/dL Final   HCT 97/89/7975  42.5  39.0 - 52.0 % Final   MCV 08/31/2022 91.2  80.0 - 100.0 fL Final   MCH 08/31/2022 31.8  26.0 - 34.0 pg Final   MCHC 08/31/2022 34.8  30.0 - 36.0 g/dL Final   RDW 97/89/7975 13.5  11.5 - 15.5 % Final   Platelets 08/31/2022 219  150 - 400 K/uL Final   nRBC 08/31/2022 0.0  0.0 - 0.2 % Final   Neutrophils Relative % 08/31/2022 67  % Final   Neutro Abs 08/31/2022 6.0  1.7 - 7.7 K/uL Final   Lymphocytes Relative 08/31/2022 23  % Final   Lymphs Abs 08/31/2022 2.0  0.7 - 4.0 K/uL Final   Monocytes Relative 08/31/2022 7  % Final   Monocytes Absolute 08/31/2022 0.6  0.1 - 1.0 K/uL Final   Eosinophils Relative 08/31/2022 2  % Final   Eosinophils Absolute 08/31/2022 0.2  0.0 - 0.5 K/uL Final   Basophils Relative 08/31/2022 1  % Final   Basophils Absolute 08/31/2022 0.0  0.0 - 0.1 K/uL Final   Immature Granulocytes 08/31/2022 0  % Final  Abs Immature Granulocytes 08/31/2022 0.01  0.00 - 0.07 K/uL Final   Sodium 08/31/2022 137  135 - 145 mmol/L Final   Potassium 08/31/2022 4.1  3.5 - 5.1 mmol/L Final   Chloride 08/31/2022 102  98 - 111 mmol/L Final   CO2 08/31/2022 25  22 - 32 mmol/L Final   Glucose, Bld 08/31/2022 98  70 - 99 mg/dL Final   BUN 97/89/7975 18  6 - 20 mg/dL Final   Creatinine, Ser 08/31/2022 1.09  0.61 - 1.24 mg/dL Final   Calcium  08/31/2022 9.4  8.9 - 10.3 mg/dL Final   Total Protein 97/89/7975 7.3  6.5 - 8.1 g/dL Final   Albumin 97/89/7975 4.6  3.5 - 5.0 g/dL Final   AST 97/89/7975 17  15 - 41 U/L Final   ALT 08/31/2022 34  0 - 44 U/L Final   Alkaline Phosphatase 08/31/2022 60  38 - 126 U/L Final   Total Bilirubin 08/31/2022 0.7  0.3 - 1.2 mg/dL Final   GFR, Estimated 08/31/2022 >60  >60 mL/min Final   Anion gap 08/31/2022 10  5 - 15 Final   Uric Acid, Serum 08/31/2022 7.1  3.7 - 8.6 mg/dL Final  No image results found. No results found.       ASSESSMENT & PLAN   Assessment & Plan Prediabetes Previously on Ozempic, which was discontinued due to lack of efficacy  in weight loss. Current A1c is 5.6, below the diabetes threshold. Insurance requires A1c >6.5 for Mounjaro  approval, which is preferred for weight loss and prediabetes management. Attempt to obtain Mounjaro , expecting initial insurance rejection. If rejected, attempt to obtain Zepbound  for obstructive sleep apnea as an alternative. 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 Not on allopurinol or other prophylaxis. OSA (obstructive sleep apnea) No current records available to support diagnosis for insurance purposes. Zepbound  considered for weight loss and sleep apnea management if Mounjaro  is not approved. Request sleep apnea records from [facility], specifically from Dr. Randine Bihari, to support insurance claims for Zepbound . High risk sexual behavior, unspecified type HIV pre-exposure prophylaxis (PrEP) management   Currently on Truvada for PrEP. Interested in switching to long-acting PrEP due to adherence issues, nausea, and stigma. Submit insurance request for [place], expecting initial rejection due to cost. If rejected, consider Apretude as an alternative. Submit appeal for coverage exclusion citing difficulty remembering Truvada, inferior efficacy, and stigma. Send prescriptions to [facility] for better prior authorization handling. Anxiety Managed with paroxetine . Refill paroxetine  prescription. Dyslipidemia LDL cholesterol level is 56, indicating good control. Primary hypertension Managed with amlodipine.  Follow-Up   Return in one month for a physical exam and lab work. Request records from previous primary care provider to better understand past medical management.  ORDER ASSOCIATIONS  #   DIAGNOSIS / CONDITION ICD-10 ENCOUNTER ORDER     ICD-10-CM   1. Prediabetes  R73.03 tirzepatide  (MOUNJARO ) 2.5 MG/0.5ML Pen    DISCONTINUED: tirzepatide  (MOUNJARO ) 2.5 MG/0.5ML Pen    2. Idiopathic gout involving toe of  right foot, unspecified chronicity  M10.071     3. OSA (obstructive sleep apnea)  G47.33     4. High risk sexual behavior, unspecified type  Z72.51 SQ injection lenacapavir (YEZTUGO) 463.5 MG/1.5ML SQ injection    5. Anxiety  F41.9 PARoxetine  (PAXIL ) 40 MG tablet    6. Dyslipidemia  E78.5     7. Primary hypertension  I10  Orders Placed in Encounter:    tirzepatide  (MOUNJARO ) 2.5 MG/0.5ML Pen  Weekly,   Status:  Discontinued         SQ injection lenacapavir (YEZTUGO) 463.5 MG/1.5ML SQ injection  Every 6 months       Note to Pharmacy: patient requests appeal for coverage exclusion due to difficulty remembering Truvada inferior efficacy problems with stigma    PARoxetine  (PAXIL ) 40 MG tablet  BH-each morning         tirzepatide  (MOUNJARO ) 2.5 MG/0.5ML Pen  Weekly       Note to Pharmacy: he was on Ozempic for prediabetes and  he stopped losing weight and he felt it was not working anymore would like to switch to Mounjaro ; if no coverage submit as Zepbound  for confirmed obstructive sleep apnea.        This document was synthesized by artificial intelligence ([employer]bridge) using HIPAA-compliant recording of the clinical interaction;   We discussed the use of AI scribe software for clinical note transcription with the patient, who gave verbal consent to proceed. additional Info: This encounter employed state-of-the-art, real-time, collaborative documentation. The patient actively reviewed and assisted in updating their electronic medical record on a shared screen, ensuring transparency and facilitating joint problem-solving for the problem list, overview, and plan. This approach promotes accurate, informed care. The treatment plan was discussed and reviewed in detail, including medication safety, potential side effects, and all patient questions. We confirmed understanding and comfort with the plan. Follow-up instructions were established, including contacting the office for any concerns,  returning if symptoms worsen, persist, or new symptoms develop, and precautions for potential emergency department visits.

## 2024-03-16 NOTE — Assessment & Plan Note (Signed)
 HIV pre-exposure prophylaxis (PrEP) management   Currently on Truvada for PrEP. Interested in switching to long-acting PrEP due to adherence issues, nausea, and stigma. Submit insurance request for [place], expecting initial rejection due to cost. If rejected, consider Apretude as an alternative. Submit appeal for coverage exclusion citing difficulty remembering Truvada, inferior efficacy, and stigma. Send prescriptions to [facility] for better prior authorization handling.

## 2024-03-16 NOTE — Assessment & Plan Note (Signed)
 LDL cholesterol level is 56, indicating good control.

## 2024-03-16 NOTE — Patient Instructions (Addendum)
 VISIT SUMMARY: During your visit, we discussed your concerns about weight management, medication adjustments, and other health issues. We reviewed your current medications and conditions, including prediabetes, sleep apnea, anxiety, gout, and your current HIV pre-exposure prophylaxis (PrEP) regimen. We also discussed your recent travel and the medications you used during that time.  YOUR PLAN: -PREDIABETES: Prediabetes means your blood sugar levels are higher than normal but not high enough to be classified as diabetes. We will attempt to get approval for Mounjaro , a medication that may help with weight loss and managing prediabetes. If insurance does not approve Mounjaro , we will consider Zepbound  as an alternative.  -OBSTRUCTIVE SLEEP APNEA: Obstructive sleep apnea is a condition where your breathing stops and starts during sleep. We will request your sleep apnea records from Dr. Randine Bihari to support insurance claims for Zepbound , which may help with both weight loss and sleep apnea management if Mounjaro  is not approved.  -HIV PRE-EXPOSURE PROPHYLAXIS (PREP) MANAGEMENT: PrEP is a medication taken to prevent HIV infection. You are currently on Truvada but are interested in switching to a long-acting option due to adherence issues and side effects. We will submit an insurance request for [place] and, if rejected, consider Apretude as an alternative. We will also send your prescriptions to [facility] for better handling of prior authorizations.  -GOUT, RIGHT FOOT: Gout is a form of arthritis characterized by sudden, severe attacks of pain, redness, and tenderness in joints. You had a single attack in your right big toe and are not currently on any preventive medication.  -ANXIETY DISORDER: Anxiety disorder involves excessive worry or fear. Your condition is managed with paroxetine , and we will refill your prescription.  -HYPERLIPIDEMIA: Hyperlipidemia means having high levels of fats  (lipids) in your blood. Your LDL cholesterol level is well-controlled at 56.  -HYPERTENSION: Hypertension is high blood pressure. Your condition is managed with amlodipine.  -ARTHRITIS: Arthritis is inflammation of the joints, causing pain and stiffness. Your condition is managed with Xelprox.  INSTRUCTIONS: Please return in one month for a physical exam and lab work. We will also request records from your previous primary care provider to better understand your past medical management.  Welcome aboard!   Today's visit was a valuable first step in understanding your health and starting your personalized care journey. We discussed your medical history and medications in detail. Given the extensive information, we prioritized addressing your most pressing concerns.  We understood those concerns to be:  New pt  (Pt is present to est care with pcp concerns about weight was on ozempic for while then stop taking it only lost like 1 to 2 lbs was taking for A1c would like to try monjuro.)   Building a Complete Picture  To create the most effective care plan possible, we may need additional information from previous providers. We encouraged you to gather any relevant medical records for your next visit. This will help us  build a more complete picture and develop a personalized plan together. In the meantime, we'll address your immediate concerns and provide resources to help you manage all of your medical issues.  We encourage you to use MyChart to review these efforts, and to help us  find and correct any omissions or errors in your medical chart.  Managing Your Health Over Time  Managing every aspect of your health in a single visit isn't always feasible, but that's okay.  We addressed your most pressing concerns today and charted a course for future care. Acute conditions or  preventive care measures may require further attention.  We encourage you to schedule a follow-up visit at your earliest  convenience to discuss any unresolved issues.  We strongly encourage participation in annual preventive care visits to help us  develop a more thorough understanding of your health and to help you maintain optimal wellness - please inquire about scheduling your next one with us  at your earliest convenience.  Your Satisfaction Matters  It was a pleasure seeing you today!  Your health and satisfaction will always be my top priorities. If you believe your experience today was worthy of a 5-star rating, I'd be grateful for your feedback!  Bernardino KANDICE Cone, MD   Next Steps  Schedule Follow-Up:  We recommend a follow-up appointment in No follow-ups on file. If your condition worsens before then, please call us  or seek emergency care. Preventive Care:  Don't forget to schedule your annual preventive care visit!  This important checkup is typically covered by insurance and helps identify potential health issues early.  Typically its 100% insurance covered with no co-pay and helps to get surveillance labwork paid for through your insurance provider.  Sometimes it even lowers your insurance premiums to participate. Medical Information Release:  For any relevant medical information we don't have, please sign a release form so we can obtain it for your records. Lab & X-ray Appointments:  Scheduled any incomplete lab tests today or call us  to schedule.  X-Rays can be done without an appointment at [facility] at [facility] ([address]), M-F 8:30am-noon or 1pm-5pm.  Just tell them you're there for X-rays ordered by Dr. Cone.  We'll receive the results and contact you by phone or MyChart to discuss next steps.  Bring to Your Next Appointment  Medications: Please bring all your medication bottles to your next appointment to ensure we have an accurate record of your prescriptions. Health Diaries: If you're monitoring any health conditions at home, keeping a diary of your readings can be very  helpful for discussions at your next appointment.  Reviewing Your Records  Please Review this early draft of your clinical notes below and the final encounter summary tomorrow on MyChart after its been completed.   Prediabetes -     Tirzepatide ; Inject 2.5 mg into the skin once a week.  Dispense: 2 mL; Refill: 11  Idiopathic gout involving toe of right foot, unspecified chronicity  OSA (obstructive sleep apnea)  High risk sexual behavior, unspecified type [place]; Inject 3 mLs (927 mg total) into the skin every 6 (six) months. Administer each injection subcutaneously at separate sites in the abdomen (more or equal to 2 inches from the navel).  Dispense: 3 mL; Refill: 1  Anxiety -     PARoxetine  HCl; Take 1 tablet (40 mg total) by mouth every morning.  Dispense: 90 tablet; Refill: 4  Dyslipidemia  Primary hypertension     Getting Answers and Following Up  Simple Questions & Concerns: For quick questions or basic follow-up after your visit, reach us  at [phone] or MyChart messaging. Complex Concerns: If your concern is more complex, scheduling an appointment might be best. Discuss this with the staff to find the most suitable option. Lab & Imaging Results: We'll contact you directly if results are abnormal or you don't use MyChart. Most normal results will be on MyChart within 2-3 business days, with a review message from Dr. Cone. Haven't heard back in 2 weeks? Need results sooner? Contact us   at [phone]. Referrals: Our referral coordinator will manage specialist referrals. The specialist's office should contact you within 2 weeks to schedule an appointment. Call us  if you haven't heard from them after 2 weeks.  Staying Connected  MyChart: Activate your MyChart for the fastest way to access results and message us . See the last page of this paperwork for instructions.  Billing  X-ray & Lab Orders: These are billed by separate companies. Contact the invoicing  company directly for questions or concerns. Visit Charges: Discuss any billing inquiries with our administrative services team.  Feedback & Satisfaction  Share Your Experience: We strive for your satisfaction! If you have any complaints, please let Dr. Jesus know directly or contact our Practice Administrators, Manuelita Rubin or [employer], by asking at the front desk.  Scheduling Tips  Shorter Wait Times: 8 am and 1 pm appointments often have the quickest wait times. Longer Appointments: If you need more time during your visit, talk to the front desk. Due to insurance regulations, multiple back-to-back appointments might be necessary.

## 2024-03-16 NOTE — Assessment & Plan Note (Signed)
 No current records available to support diagnosis for insurance purposes. Zepbound  considered for weight loss and sleep apnea management if Mounjaro  is not approved. Request sleep apnea records from [facility], specifically from Dr. Randine Bihari, to support insurance claims for Zepbound .

## 2024-03-16 NOTE — Assessment & Plan Note (Signed)
 Single attack in the right big toe. Not on allopurinol or other prophylaxis.

## 2024-03-16 NOTE — Assessment & Plan Note (Signed)
 Previously on Ozempic, which was discontinued due to lack of efficacy in weight loss. Current A1c is 5.6, below the diabetes threshold. Insurance requires A1c >6.5 for Mounjaro  approval, which is preferred for weight loss and prediabetes management. Attempt to obtain Mounjaro , expecting initial insurance rejection. If rejected, attempt to obtain Zepbound  for obstructive sleep apnea as an alternative.

## 2024-03-16 NOTE — Assessment & Plan Note (Signed)
Managed with amlodipine.

## 2024-03-16 NOTE — Telephone Encounter (Signed)
 Copied from CRM [id number]. Topic: Clinical - Prescription Issue >> Mar 16, 2024  2:10 PM Harlene ORN wrote: Reason for CRM: tiffany - [facility]  REP called about a prescribtion for Yeztugo injections. insurance is requiring that the prescription be sent to [facility] for it to be covered. (Phone: [phone])  Phone for [facility]: [phone]

## 2024-03-16 NOTE — Addendum Note (Signed)
 Addended by: Marijo Quizon G on: 03/16/2024 08:39 PM   Modules accepted: Orders

## 2024-03-16 NOTE — Assessment & Plan Note (Signed)
 Managed with paroxetine . Refill paroxetine  prescription.

## 2024-03-17 ENCOUNTER — Other Ambulatory Visit: Payer: Self-pay

## 2024-03-17 DIAGNOSIS — Z7251 High risk heterosexual behavior: Secondary | ICD-10-CM

## 2024-03-17 MED ORDER — YEZTUGO 463.5 MG/1.5ML ~~LOC~~ SOLN: 927.0000 mg | SUBCUTANEOUS | 1 refills | Status: DC

## 2024-03-17 NOTE — Addendum Note (Signed)
 Addended by: Ardine Iacovelli G on: 03/17/2024 08:29 PM   Modules accepted: Orders

## 2024-03-18 ENCOUNTER — Encounter: Payer: Self-pay | Admitting: Internal Medicine

## 2024-03-19 ENCOUNTER — Other Ambulatory Visit: Payer: Self-pay

## 2024-03-19 ENCOUNTER — Telehealth: Payer: Self-pay

## 2024-03-19 ENCOUNTER — Other Ambulatory Visit (HOSPITAL_COMMUNITY): Payer: Self-pay

## 2024-03-19 ENCOUNTER — Other Ambulatory Visit (HOSPITAL_BASED_OUTPATIENT_CLINIC_OR_DEPARTMENT_OTHER): Payer: Self-pay

## 2024-03-19 DIAGNOSIS — G4733 Obstructive sleep apnea (adult) (pediatric): Secondary | ICD-10-CM

## 2024-03-19 NOTE — Telephone Encounter (Signed)
 Ozempic/Mounjaro  is approved exclusively as an adjunct to diet and exercise to improve glycemic  control in adults with type 2 diabetes mellitus. A review of patient's medical chart reveals no  documented diagnosis of type 2 diabetes or an A1C indicative of diabetes. Therefore, they do not  currently meet the criteria for prior authorization of this medication. If clinically appropriate, alternative  options such as Saxenda, Zepbound , or Georjean may be considered for this patient.   Ran test bill on Zepbound . Patient insurance has plan benefit exclusion for weight loss medications.

## 2024-03-22 DIAGNOSIS — R002 Palpitations: Secondary | ICD-10-CM | POA: Diagnosis not present

## 2024-03-23 ENCOUNTER — Other Ambulatory Visit

## 2024-03-23 ENCOUNTER — Other Ambulatory Visit: Payer: Self-pay | Admitting: Internal Medicine

## 2024-03-23 ENCOUNTER — Telehealth: Payer: Self-pay

## 2024-03-23 ENCOUNTER — Other Ambulatory Visit (HOSPITAL_BASED_OUTPATIENT_CLINIC_OR_DEPARTMENT_OTHER): Payer: Self-pay

## 2024-03-23 ENCOUNTER — Other Ambulatory Visit (HOSPITAL_COMMUNITY): Payer: Self-pay

## 2024-03-23 DIAGNOSIS — G4733 Obstructive sleep apnea (adult) (pediatric): Secondary | ICD-10-CM

## 2024-03-23 MED ORDER — TIRZEPATIDE-WEIGHT MANAGEMENT 2.5 MG/0.5ML ~~LOC~~ SOAJ: 2.5000 mg | SUBCUTANEOUS | 11 refills | Status: DC

## 2024-03-23 NOTE — Telephone Encounter (Signed)
 Pharmacy Patient Advocate Encounter   Received notification from Pt Calls Messages that prior authorization for Zepbound  2.5MG /0.5ML pen-injectors is required/requested.   Insurance verification completed.   The patient is insured through [facility] .   Per test claim: PA required; PA submitted to above mentioned insurance via Latent Key/confirmation #/EOC [id number] Status is pending

## 2024-03-24 ENCOUNTER — Other Ambulatory Visit (HOSPITAL_COMMUNITY): Payer: Self-pay

## 2024-03-24 NOTE — Telephone Encounter (Signed)
 Pharmacy Patient Advocate Encounter  Received notification from [facility] that Prior Authorization for Zepbound  2.5MG /0.5ML pen-injectors  has been DENIED.  Full denial letter will be uploaded to the media tab. See denial reason below.    PA #/Case ID/Reference #: [id number]

## 2024-03-26 ENCOUNTER — Other Ambulatory Visit (HOSPITAL_COMMUNITY): Payer: Self-pay

## 2024-03-26 ENCOUNTER — Ambulatory Visit: Payer: Self-pay | Admitting: Physician Assistant

## 2024-03-30 ENCOUNTER — Other Ambulatory Visit (HOSPITAL_COMMUNITY): Payer: Self-pay

## 2024-04-12 ENCOUNTER — Telehealth: Payer: Self-pay | Admitting: Pharmacist

## 2024-04-12 ENCOUNTER — Other Ambulatory Visit (HOSPITAL_COMMUNITY): Payer: Self-pay

## 2024-04-12 NOTE — Telephone Encounter (Signed)
 Appeal has been submitted for Zepbound . Will advise when response is received, please be advised that most companies may take 30 days to make a decision. Appeal letter and supporting documentation have been faxed to [phone] on 04/12/2024 @9 :25 am.  Thank you, Devere Pandy, PharmD Clinical Pharmacist  [place]  Direct Dial: [phone]

## 2024-04-14 ENCOUNTER — Other Ambulatory Visit (HOSPITAL_COMMUNITY): Payer: Self-pay

## 2024-04-16 NOTE — Telephone Encounter (Signed)
 Insurance has denied the appeal for Zepbound , the medication is not covered under the patient's plan irregardless of the diagnosis.  Full letter has been uploaded to media tab.

## 2024-04-19 ENCOUNTER — Other Ambulatory Visit (HOSPITAL_COMMUNITY): Payer: Self-pay

## 2024-04-19 ENCOUNTER — Telehealth: Payer: Self-pay | Admitting: Pharmacist

## 2024-04-19 NOTE — Telephone Encounter (Signed)
 As no new clinical information has been provided, and the denial specifies that this medication is not covered under the patient's pharmacy benefit plan, we will not be submitting a second-level appeal at this time. Insurers often make updates to their formularies at the beginning of the year, and we could certainly revisit an appeal at that time.  Thank you, Devere Pandy, PharmD Clinical Pharmacist  [place]  Direct Dial: [phone]

## 2024-04-20 ENCOUNTER — Other Ambulatory Visit (HOSPITAL_COMMUNITY)
Admission: RE | Admit: 2024-04-20 | Discharge: 2024-04-20 | Disposition: A | Discharge status: Home / Self Care | Source: Ambulatory Visit | Attending: Internal Medicine | Admitting: Internal Medicine

## 2024-04-20 ENCOUNTER — Encounter: Payer: Self-pay | Admitting: Internal Medicine

## 2024-04-20 ENCOUNTER — Ambulatory Visit: Admitting: Internal Medicine

## 2024-04-20 VITALS — BP 136/88 | HR 93 | Temp 98.0°F | Ht 71.5 in | Wt 287.0 lb

## 2024-04-20 DIAGNOSIS — Z0001 Encounter for general adult medical examination with abnormal findings: Secondary | ICD-10-CM | POA: Diagnosis not present

## 2024-04-20 DIAGNOSIS — Z125 Encounter for screening for malignant neoplasm of prostate: Secondary | ICD-10-CM

## 2024-04-20 DIAGNOSIS — Z1211 Encounter for screening for malignant neoplasm of colon: Secondary | ICD-10-CM

## 2024-04-20 DIAGNOSIS — Z13220 Encounter for screening for lipoid disorders: Secondary | ICD-10-CM | POA: Diagnosis not present

## 2024-04-20 DIAGNOSIS — G4733 Obstructive sleep apnea (adult) (pediatric): Secondary | ICD-10-CM

## 2024-04-20 DIAGNOSIS — F419 Anxiety disorder, unspecified: Secondary | ICD-10-CM

## 2024-04-20 DIAGNOSIS — Z119 Encounter for screening for infectious and parasitic diseases, unspecified: Secondary | ICD-10-CM | POA: Diagnosis present

## 2024-04-20 LAB — COMPREHENSIVE METABOLIC PANEL WITH GFR
ALT: 29 U/L (ref 0–53)
AST: 18 U/L (ref 0–37)
Albumin: 4.5 g/dL (ref 3.5–5.2)
Alkaline Phosphatase: 62 U/L (ref 39–117)
BUN: 13 mg/dL (ref 6–23)
CO2: 26 meq/L (ref 19–32)
Calcium: 9.3 mg/dL (ref 8.4–10.5)
Chloride: 104 meq/L (ref 96–112)
Creatinine, Ser: 0.93 mg/dL (ref 0.40–1.50)
GFR: 91.97 mL/min (ref >60.00)
Glucose, Bld: 99 mg/dL (ref 70–99)
Potassium: 4.2 meq/L (ref 3.5–5.1)
Sodium: 138 meq/L (ref 135–145)
Total Bilirubin: 0.5 mg/dL (ref 0.2–1.2)
Total Protein: 6.8 g/dL (ref 6.0–8.3)

## 2024-04-20 LAB — CBC WITH DIFFERENTIAL/PLATELET
Basophils Absolute: 0 K/uL (ref 0.0–0.1)
Basophils Relative: 0.9 % (ref 0.0–3.0)
Eosinophils Absolute: 0.1 K/uL (ref 0.0–0.7)
Eosinophils Relative: 2.4 % (ref 0.0–5.0)
HCT: 41.6 % (ref 39.0–52.0)
Hemoglobin: 14.3 g/dL (ref 13.0–17.0)
Lymphocytes Relative: 37.8 % (ref 12.0–46.0)
Lymphs Abs: 2.1 K/uL (ref 0.7–4.0)
MCHC: 34.4 g/dL (ref 30.0–36.0)
MCV: 91.9 fl (ref 78.0–100.0)
Monocytes Absolute: 0.4 K/uL (ref 0.1–1.0)
Monocytes Relative: 7.6 % (ref 3.0–12.0)
Neutro Abs: 2.9 K/uL (ref 1.4–7.7)
Neutrophils Relative %: 51.3 % (ref 43.0–77.0)
Platelets: 210 K/uL (ref 150.0–400.0)
RBC: 4.53 Mil/uL (ref 4.22–5.81)
RDW: 14.6 % (ref 11.5–15.5)
WBC: 5.6 K/uL (ref 4.0–10.5)

## 2024-04-20 LAB — HEMOGLOBIN A1C: Hgb A1c MFr Bld: 6 % (ref 4.6–6.5)

## 2024-04-20 LAB — LIPID PANEL
Cholesterol: 147 mg/dL (ref 0–200)
HDL: 32.9 mg/dL — ABNORMAL LOW (ref >39.00)
LDL Cholesterol: 61 mg/dL (ref 0–99)
NonHDL: 113.72
Total CHOL/HDL Ratio: 4
Triglycerides: 263 mg/dL — ABNORMAL HIGH (ref 0.0–149.0)
VLDL: 52.6 mg/dL — ABNORMAL HIGH (ref 0.0–40.0)

## 2024-04-20 LAB — PSA: PSA: 0.43 ng/mL (ref 0.10–4.00)

## 2024-04-20 NOTE — Assessment & Plan Note (Signed)
 Obstructive sleep apnea   Obstructive sleep apnea remains inadequately controlled despite CPAP therapy, leading to daytime lethargy, concentration issues, and memory problems that affect work performance. He wakes up gasping for air and has a family history of dementia, raising concerns about long-term cognitive effects. A thick neck and small airway complicate CPAP management. Tirzepatide  is being pursued to reduce neck size and improve CPAP efficacy, pending insurance approval. Surgical options are considered as an adjunct to medication. Resubmit prior authorization for tirzepatide  with new clinical documentation and report the insurance company to [employer] for denial of necessary treatment. Refer to ENT for surgical evaluation and encourage scheduling with ENT to demonstrate seriousness to insurance. Continue documenting the impact of sleep apnea on quality of life for appeal purposes.

## 2024-04-20 NOTE — Patient Instructions (Signed)
 VISIT SUMMARY: During your visit, we discussed the ongoing challenges you are facing with managing your sleep apnea despite using a CPAP machine. We also reviewed your current medications and their impact on your condition, as well as your concerns about daytime lethargy, concentration issues, and memory problems. Additionally, we addressed your anxiety and depression, and how these may be related to your sleep apnea. We also covered general health maintenance, including screenings for prostate and colon cancer, and vitamin D supplementation.  YOUR PLAN: -OBSTRUCTIVE SLEEP APNEA: Obstructive sleep apnea is a condition where your airway becomes blocked during sleep, causing breathing interruptions. We will resubmit the prior authorization for tirzepatide  to help reduce your neck size and improve CPAP efficacy. We are also considering surgical options and will refer you to an ENT specialist for evaluation. Please continue documenting how sleep apnea affects your quality of life for appeal purposes.  -PRIMARY HYPERTENSION: Primary hypertension is high blood pressure with no identifiable cause. It is currently managed with amlodipine and olmesartan. Since sleep apnea can contribute to high blood pressure, improving your sleep apnea management may help control your hypertension.  -ANXIETY AND DEPRESSION: Anxiety and depression are mental health conditions that can cause persistent feelings of worry and sadness. These conditions are managed with Paxil . Inadequate treatment of sleep apnea may worsen your anxiety and depression, so improving your sleep apnea management is crucial.  -GENERAL HEALTH MAINTENANCE: Routine health maintenance is important for overall well-being. We have ordered a PSA test for prostate cancer screening and a Cologuard test for colon cancer screening. We recommend daily vitamin D supplementation (1,000-5,000 IU). We also offer anal cancer screening for men who have sex with  men.  INSTRUCTIONS: Please follow up with the ENT specialist for a surgical evaluation regarding your sleep apnea. Continue documenting the impact of sleep apnea on your quality of life for appeal purposes. We will resubmit the prior authorization for tirzepatide  and report the insurance company to [employer] for denial of necessary treatment. Additionally, complete the PSA and Cologuard tests as ordered.  Building Your Long-Term Health Plan  During today's preventive visit, we covered a variety of important health checks to help you stay on top of your well-being.  We also discussed strategies to maintain your health and identified some areas that might benefit from further exploration.   Preventive care visits like today's are designed to be proactive, but sometimes additional attention may be needed.  Rest assured, we're here for you.  If these areas require further evaluation or management, we'd be happy to schedule a separate, focused appointment to address them in detail.  Addressing Next Steps  [x]   Follow-up Visit: To ensure we address any unresolved issues and continue monitoring your overall health, we recommend scheduling a follow-up appointment in 1 year for your next preventive care visit. If you experience any new problems, need to discuss any medical concerns, or your condition worsens before then, please don't hesitate to call our office to schedule an appointment or seek emergency care as needed.  [x]   Preventive Measures: Maintaining healthy habits plays a crucial role in overall wellness. We recommend considering these tips: [x]   Regular appointments with dental and vision professionals [x]   Nightly nasal saline mist to keep sinuses clear [x]   Consistent toothbrushing to maintain oral health [x]   Using an app like [employer]oreLab to track sleep quality [x]   Routine checks of blood pressure and heart rate [x]   Medical Information: In some instances, we may require  additional  medical information from other providers to create a comprehensive picture of your health. If applicable, we can provide a medical information release form at the front desk for you to sign, allowing us  to gather these records. [x]   Lab Tests: If any lab tests were ordered today, scheduling them within a week of your visit helps ensure the best possible insurance coverage.  Planning Follow Up to Work on a Problem? Make the Most of Our Focused (20 minute) Appointments  [x]   Clearly state your top concerns at the beginning of the visit to focus our discussion [x]   If you anticipate you will need more time, please inform the front desk during scheduling - we can book multiple appointments in the same week. [x]   If you have transportation problems- use our convenient video appointments or ask about transportation support. [x]   We can get down to business faster if you use MyChart to update information before the visit and submit non-urgent questions before your visit. Thank you for taking the time to provide details through MyChart.  Let our nurse know and she can import this information into your encounter documents.  Arrival and Wait Times  [x]   Arriving on time ensures that everyone receives prompt attention. [x]   Early morning (8a) and afternoon (1p) appointments tend to have shortest wait times. [x]   Unfortunately, we cannot delay appointments for late arrivals or hold slots during phone calls.  Bring to Your Next Appointment:  [x]   Medications: Please bring all your medication bottles to your next appointment to ensure we have an accurate record of your prescriptions. [x]   Health Diaries: If you're monitoring any health conditions at home, keeping a diary of your readings can be very helpful for discussions at your next appointment.  Reviewing Your Records  [x]   Review your attached preventive care information at the end of these patient instructions. [x]   Review this early draft  of your clinical encounter notes below and the final encounter summary tomorrow on MyChart after its been completed.      Getting Answers and Following Up  [x]   Simple Questions & Concerns: For quick questions or basic follow-up after your visit, reach us  at [phone] or MyChart messaging. [x]   Complex Concerns: If your concern is more complex, scheduling an appointment might be best. Discuss this with the staff to find the most suitable option. [x]   Lab & Imaging Results: We'll contact you directly if results are abnormal or you don't use MyChart. Most normal results will be on MyChart within 2-3 business days, with a review message from Dr. Jesus. Haven't heard back in 2 weeks? Need results sooner? Contact us  at [phone]. [x]   Referrals: Our referral coordinator will manage specialist referrals. The specialist's office should contact you within 2 weeks to schedule an appointment. Call us  if you haven't heard from them after 2 weeks.  Staying Connected  [x]   MyChart: Activate your MyChart for the fastest way to access results and message us . See the last page of this paperwork for instructions on how to activate.  Billing  [x]   X-ray & Lab Orders: These are billed by separate companies. Contact the invoicing company directly for questions or concerns. [x]   Visit Charges: Discuss any billing inquiries with our administrative services team.  Your Satisfaction Matters  [x]   Share Your Experience: We strive for your satisfaction! If you have any complaints, or preferably compliments, please let Dr. Jesus know directly or contact our Practice Administrators, Manuelita Rubin or Vickery  Boutaib, by asking at the front desk.                 Next Steps  [x]   Schedule Follow-Up:  We recommend a follow-up appointment in 1 year for your next wellness visit.  If you develop any new problems, want to address any medical issues, or your condition worsens before then, please call  us  for an appointment or seek emergency care. [x]   Preventive Care:  Make sure to keep regular appointments with dental and vision professionals, use nightly nasal saline mist sprays to keep your sinuses clear and toothbrushing to protect your teeth. Use [employer] App or other app to track your sleep quality. Check blood pressure and heart rate routinely. [x]   Medical Information Release:  For any relevant medical information we don't have, please sign a release form at the front desk so we can obtain it for your records. [x]   Lab Tests:  Schedule any lab tests from today for within a week to ensure best insurance coverage.    Making the Most of Our Focused (20 minute) Appointments:  [x]   Clearly state your top concerns at the beginning of the visit to focus our discussion [x]   If you anticipate you will need more time, please inform the front desk during scheduling - we can book multiple appointments in the same week. [x]   If you have transportation problems- use our convenient video appointments or ask about transportation support. [x]   We can get down to business faster if you use MyChart to update information before the visit and submit non-urgent questions before your visit. Thank you for taking the time to provide details through MyChart.  Let our nurse know and she can import this information into your encounter documents.  Arrival and Wait Times: [x]   Arriving on time ensures that everyone receives prompt attention. [x]   Early morning (8a) and afternoon (1p) appointments tend to have shortest wait times. [x]   Unfortunately, we cannot delay appointments for late arrivals or hold slots during phone calls.  Bring to Your Next Appointment  [x]   Medications: Please bring all your medication bottles to your next appointment to ensure we have an accurate record of your prescriptions. [x]   Health Diaries: If you're monitoring any health conditions at home, keeping a diary of your readings can be  very helpful for discussions at your next appointment.  Reviewing Your Records  [x]   Review your attached preventive care information at the end of these patient instructions. [x]   Review this early draft of your clinical encounter notes below and the final encounter summary tomorrow on MyChart after its been completed.   Encounter for annual general medical examination with abnormal findings in adult -     Lipid panel -     Comprehensive metabolic panel with GFR -     CBC with Differential/Platelet -     TSH Rfx on Abnormal to Free T4 -     Hemoglobin A1c -     HIV Antibody (routine testing w rflx) -     HCV Ab w Reflex to Quant PCR -     PSA -     Cologuard  OSA (obstructive sleep apnea) -     Ambulatory referral to ENT  Screening for lipoid disorders  Screening examination for infectious disease -     Urine cytology ancillary only; Future  Screening for malignant neoplasm of prostate -     PSA  Screening for malignant neoplasm of colon  Anxiety     Getting Answers and Following Up  [x]   Simple Questions & Concerns: For quick questions or basic follow-up after your visit, reach us  at [phone] or MyChart messaging. [x]   Complex Concerns: If your concern is more complex, scheduling an appointment might be best. Discuss this with the staff to find the most suitable option. [x]   Lab & Imaging Results: We'll contact you directly if results are abnormal or you don't use MyChart. Most normal results will be on MyChart within 2-3 business days, with a review message from Dr. Jesus. Haven't heard back in 2 weeks? Need results sooner? Contact us  at [phone]. [x]   Referrals: Our referral coordinator will manage specialist referrals. The specialist's office should contact you within 2 weeks to schedule an appointment. Call us  if you haven't heard from them after 2 weeks.  Staying Connected  [x]   MyChart: Activate your MyChart for the fastest way to access results and  message us . See the last page of this paperwork for instructions on how to activate.  Billing  [x]   X-ray & Lab Orders: These are billed by separate companies. Contact the invoicing company directly for questions or concerns. [x]   Visit Charges: Discuss any billing inquiries with our administrative services team.  Your Satisfaction Matters  [x]   Share Your Experience: We strive for your satisfaction! If you have any complaints, or preferably compliments, please let Dr. Jesus know directly or contact our Practice Administrators, Manuelita Rubin or [employer], by asking at the front desk.    Medical Screening Exam A medical screening exam (MSE) helps to determine whether you need immediate medical treatment relating to any number of symptoms you are having. This type of exam may be done in an emergency department, an urgent care setting, or your health care provider's office. Depending on your symptoms and severity, you may need additional tests or medical therapy. It is important to note that an MSE does not necessarily mean that you will need or receive further medical testing or interventions if your symptoms are not deemed to be medically urgent (emergent). Tell a health care provider about: Any allergies you have. All medicines you are taking, including vitamins, herbs, eye drops, creams, and over-the-counter medicines. Any problems you or family members have had with anesthetic medicines. Any bleeding problems you have. Any surgeries you have had. Any medical conditions you have. Whether you are pregnant or may be pregnant. What happens during the test? During the exam, a health care provider does a short, often focused, physical exam and asks about your medical history to assess: Your current symptoms. Your overall health. Your need for possible further medical intervention. What can I expect after the test? If you have a regular health care provider, make an appointment for a  follow-up visit with him or her. If you do not have a regular health care provider, ask about resources in your community. Your medical screening exam may determine that: You do not need emergency treatment at this time. You need treatment right away. You need to be transferred to another medical center. This may happen if you need an emergent specialist or consultant that is not available at the medical center you are at. You need to have more tests. A medical specialist may be consulted if needed. Get help right away if: Your condition gets worse. You develop new or troubling symptoms before you see your health care provider. These symptoms may represent a serious problem that is an emergency.  Do not wait to see if the symptoms will go away. Get medical help right away. Call your local emergency services (911 in the [place] Do not drive yourself to the hospital. Summary A medical screening exam helps to determine whether you need medical treatment right away. This type of exam may be done in an emergency department, an urgent care setting, or your health care provider's office. During the exam, a health care provider does a short physical exam and asks about your current symptoms and overall health. Depending on the exam, more tests or therapies may be ordered. However, an MSE does not necessarily mean that you will have further medical testing if your symptoms are not deemed to be urgent. If you need further care that is not offered at your current medical center, you may need to be transferred to another facility. This information is not intended to replace advice given to you by your health care provider. Make sure you discuss any questions you have with your health care provider. Document Revised: 03/21/2021 Document Reviewed: 11/16/2020 Elsevier Patient Education  2024 [employer].

## 2024-04-20 NOTE — Progress Notes (Signed)
 [facility] at [facility] [address] [place], [place] [address] Office:  [phone]  -- Annual Preventive Medical Office Visit --  Patient:  Samuel Patel      Age: 58 y.o.       Sex:  male  Date:   04/20/2024 Patient Care Team: Jesus Bernardino MATSU, MD as PCP - General (Internal Medicine) Burnard Debby LABOR, MD (Inactive) as PCP - Sleep Medicine (Cardiology) Shlomo Wilbert SAUNDERS, MD as PCP - Cardiology (Cardiology) Today's Healthcare Provider: Bernardino MATSU Jesus, MD  ========================================= Chief complaint: Annual Exam (Pt is present for cpe pt is fasting for labs today )  Purpose of Visit: Comprehensive preventive health assessment and personalized health maintenance planning.  This encounter was conducted as a Comprehensive Physical Exam (CPE) preventive care annual visit. The patient's medical history and problem list were reviewed to inform individualized preventive care recommendations.   No problem-specific medical treatment was provided during this visit.  Assessment & Plan Encounter for annual general medical examination with abnormal findings in adult Screening for lipoid disorders Screening examination for infectious disease Screening for malignant neoplasm of prostate Screening for malignant neoplasm of colon Routine health maintenance includes screenings for prostate and colon cancer, as well as vitamin D supplementation. Order PSA test for prostate cancer screening and Cologuard test for colon cancer screening. Recommend daily vitamin D supplementation (1,000-5,000 IU). Offer anal cancer screening for men who have sex with men.  Skin screened.  Smoking cessation is strongly recommended  OSA (obstructive sleep apnea) Obstructive sleep apnea   Obstructive sleep apnea remains inadequately controlled despite CPAP therapy, leading to daytime lethargy, concentration issues, and memory problems that affect work performance. He wakes up gasping for air  and has a family history of dementia, raising concerns about long-term cognitive effects. A thick neck and small airway complicate CPAP management. Tirzepatide  is being pursued to reduce neck size and improve CPAP efficacy, pending insurance approval. Surgical options are considered as an adjunct to medication. Resubmit prior authorization for tirzepatide  with new clinical documentation and report the insurance company to [employer] for denial of necessary treatment. Refer to ENT for surgical evaluation and encourage scheduling with ENT to demonstrate seriousness to insurance. Continue documenting the impact of sleep apnea on quality of life for appeal purposes. Anxiety Anxiety and depression are managed with Paxil  40 mg daily. Sleep apnea may contribute to these conditions, and inadequate treatment of sleep apnea could worsen anxiety and depression.  No diagnosis found.   Reviewed/updated/encouraged completion: Immunization History  Administered Date(s) Administered   PFIZER(Purple Top)SARS-COV-2 Vaccination 11/01/2019, 11/22/2019, 05/31/2020   Health Maintenance Due  Topic Date Due   DTaP/Tdap/Td (1 - Tdap) Never done   Colonoscopy  Never done   Influenza Vaccine  Never done   Health Maintenance  Topic Date Due   DTaP/Tdap/Td (1 - Tdap) Never done   Colonoscopy  Never done   Influenza Vaccine  Never done   COVID-19 Vaccine (4 - 2025-26 season) 05/06/2024 (Originally 03/22/2024)   Zoster Vaccines- Shingrix (1 of 2) 05/21/2024 (Originally 01/26/2018)   Pneumococcal Vaccine: 50+ Years (1 of 2 - PCV) 07/15/2024 (Originally 01/27/1987)   Hepatitis B Vaccines 19-59 Average Risk (1 of 3 - 19+ 3-dose series) 08/11/2024 (Originally 01/27/1987)   Hepatitis C Screening  03/16/2025 (Originally 01/26/1986)   HIV Screening  03/16/2025 (Originally 01/27/1983)   HPV VACCINES  Aged Out   Meningococcal B Vaccine  Aged Out    Reviewed the following verbally with patient  and provided AVS  materials:   HEALTH MAINTENANCE COUNSELING AND ANTICIPATORY GUIDANCE    Preventive Measure Recommendation  Eye Exams Every 1-2 years  Dental Care Cleanings every 6 months or more, brush/floss 3x daily  Sinus Care Saline spray rinses daily  Sleep 8 hours nightly, good sleep hygiene, e-monitoring if any daytime drowsiness  Diet Fruits/vegetables/fiber/healthy fats, balance and moderation  Exercise 150 minutes weekly  Risk Behaviors Discouraged any/all high risk behaviors    CANCER SCREENING SHARED DECISION MAKING    Penile/Testicle/Scrotum Encouraged self-monitoring and reporting of genital abnormalities. Patient reports none.  Thyroid  Checked and advised to palpate thyroid  for nodules  Prostate Individualized risks/benefits/costs discussed No results found for: PSA  Colon HM Colonoscopy never done, no gastrointestinal bleeding, agreed to cologuard           Lung Current guidelines recommend individuals aged 48 to 101 who currently smoke or formerly smoked and have a >= 20 pack-year smoking history should undergo annual screening with low-dose computed tomography (LDCT). Tobacco Use: High Risk (04/20/2024)   Patient History    Smoking Tobacco Use: Every Day    Smokeless Tobacco Use: Never    Passive Exposure: Not on file   Social History   Tobacco Use  Smoking Status Every Day   Current packs/day: 0.25   Average packs/day: 0.3 packs/day for 0.7 years (0.2 ttl pk-yrs)   Types: Cigarettes   Start date: 2025  Smokeless Tobacco Never    Skin Advised regular sunscreen use. Patient denies worrisome, changing, or new skin lesions. Offered to include images in chart for surveillance. Showed patient these pictures of melanomas for reference to educate for self-monitoring.   Other Cancers Discussed lack of screening guidelines and insurance coverage for other cancer types.    Discussed the use of AI scribe software for clinical note transcription with the patient, who gave verbal  consent to proceed.  History of Present Illness 58 year old male with sleep apnea who presents with difficulties in managing his condition despite treatment.  He experiences significant challenges in managing his sleep apnea. Despite using a CPAP machine with various types of masks, including nasal pads, he continues to experience inadequate treatment results. He wakes up gasping for air once or twice a night, which he finds terrifying. 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  Persistent drowsiness and lethargy during the day affect his concentration and memory. This is particularly concerning given his family history of dementia; his mother passed away from the condition in June of the previous year. He is also on blood pressure medication.  His current medications include amlodipine 5 mg, atorvastatin  10 mg, Celebrex 200 mg, Truvada, gabapentin, metoprolol  25 mg, olmesartan 40 mg, omeprazole 10 mg, and Paxil  40 mg. He is not currently on semaglutide, which is in the process of prior authorization.  His sleep apnea has led to increased anxiety and depression, for which he takes Paxil . His hobbies, such as gardening and cooking, have become more arduous due to his condition, impacting his quality of life.  ROS A comprehensive ROS was negative for any concerning symptoms other than obstructive sleep apnea symptom(s).  Completed medication reconciliation: Current Outpatient Medications on File Prior to Visit  Medication Sig   amLODipine (NORVASC) 5 MG tablet Take 5 mg by mouth daily.   atorvastatin  (LIPITOR) 10 MG tablet Take 10 mg by mouth daily.  celecoxib (CELEBREX) 200 MG capsule Take 200 mg by mouth daily.   emtricitabine-tenofovir (TRUVADA) 200-300 MG tablet Take 1 tablet by mouth daily.   gabapentin (NEURONTIN) 100 MG capsule Take 100 mg by mouth 3 (three) times daily as  needed.   metoprolol  succinate (TOPROL -XL) 25 MG 24 hr tablet Take 1 tablet (25 mg total) by mouth daily.   olmesartan (BENICAR) 40 MG tablet Take 40 mg by mouth daily.   omeprazole (PRILOSEC) 10 MG capsule Take 10 mg by mouth as needed.   PARoxetine  (PAXIL ) 40 MG tablet Take 1 tablet (40 mg total) by mouth every morning.   semaglutide-weight management (WEGOVY) 0.25 MG/0.5ML SOAJ SQ injection Inject 0.25 mg into the skin once a week.   SQ injection lenacapavir (YEZTUGO) 463.5 MG/1.5ML SQ injection Inject 3 mLs (927 mg total) into the skin every 6 (six) months. Administer each injection subcutaneously at separate sites in the abdomen (more or equal to 2 inches from the navel).   No current facility-administered medications on file prior to visit.  There are no discontinued medications.The following were reviewed and/or entered/updated into our electronic MEDICAL RECORD NUMBERPast Medical History:  Diagnosis Date   Anxiety    Asthma    Atrial fibrillation (HCC)    Atrial fibrillation with rapid ventricular response (HCC) 05/02/2014   Depression    Elevated cholesterol    Hypertension    OSA (obstructive sleep apnea)    Sleep apnea    Past Surgical History:  Procedure Laterality Date   APPENDECTOMY     CHOLECYSTECTOMY     TOTAL SHOULDER REPLACEMENT Left    3/4 years ago   Social History   Socioeconomic History   Marital status: Divorced    Spouse name: Not on file   Number of children: 1   Years of education: Not on file   Highest education level: Not on file  Occupational History   Not on file  Tobacco Use   Smoking status: Every Day    Current packs/day: 0.25    Average packs/day: 0.3 packs/day for 0.7 years (0.2 ttl pk-yrs)    Types: Cigarettes    Start date: 2025   Smokeless tobacco: Never  Substance and Sexual Activity   Alcohol use: Yes    Comment: occassionally   Drug use: No   Sexual activity: Yes  Other Topics Concern   Not on file  Social History Narrative    One daughter that is 6years old   Social Drivers of Corporate investment banker Strain: Not on file  Food Insecurity: Not on file  Transportation Needs: Not on file  Physical Activity: Not on file  Stress: Not on file  Social Connections: Unknown (12/01/2021)   Received from [facility]   Social Network    Social Network: Not on file  Intimate Partner Violence: Unknown (10/24/2021)   Received from [facility]   HITS    Physically Hurt: Not on file    Insult or Talk Down To: Not on file    Threaten Physical Harm: Not on file    Scream or Curse: Not on file       No data to display         Family History  Problem Relation Age of Onset   Diabetes Mother    Asthma Mother    Hyperlipidemia Mother    Hypertension Mother    Cancer Father    Depression Sister    Depression Brother    Depression Daughter  Allergies  Allergen Reactions   Penicillins    Social History   Substance and Sexual Activity  Sexual Activity Yes  @    03/16/2024    9:54 AM  Depression screen PHQ 2/9  Decreased Interest 0  Down, Depressed, Hopeless 0  PHQ - 2 Score 0      BP 136/88   Pulse 93   Temp 98 F (36.7 C) (Temporal)   Ht 5' 11.5 (1.816 m)   Wt 287 lb (130.2 kg)   SpO2 98%   BMI 39.47 kg/m  BP Readings from Last 3 Encounters:  04/20/24 136/88  03/16/24 130/82  02/25/24 104/66   Wt Readings from Last 10 Encounters:  04/20/24 287 lb (130.2 kg)  03/16/24 284 lb 12.8 oz (129.2 kg)  02/25/24 285 lb 4.8 oz (129.4 kg)  08/31/22 275 lb (124.7 kg)  08/27/22 270 lb (122.5 kg)  11/29/21 278 lb 12.8 oz (126.5 kg)  10/08/21 282 lb 6.4 oz (128.1 kg)  06/18/21 275 lb 9.6 oz (125 kg)  06/03/14 268 lb 3.2 oz (121.7 kg)  05/02/14 267 lb (121.1 kg)  Physical Exam  Physical Exam NECK: Neck is thick with a small airway posteriorly.  GEN: No acute distress, resting comfortably. HEENT: Tympanic membranes normal appearing bilaterally, oropharynx clear, no thyromegaly noted, no  palpable lymphadenopathy or thyroid  nodules. CARDIOVASCULAR: S1 and S2 heart sounds with regular rate and rhythm, no murmurs appreciated. PULMONARY: Normal work of breathing, clear to auscultation bilaterally, no crackles, wheezes, or rhonchi. ABDOMEN: Soft, nontender, nondistended. MSK: No edema, cyanosis, or clubbing noted. SKIN: Warm, dry, no lesions of concern observed. NEUROLOGICAL: Cranial nerves II-XII grossly intact, strength 5/5 in upper and lower extremities, reflexes symmetric and intact bilaterally. PSYCH: Normal affect and thought content, pleasant and cooperative.      ======================================  IMPORTANT HEALTH REMINDERS: Report any new or changing skin lesions promptly Maintain recommended screening schedules Discuss any new family history of cancer at future visits Follow up on any new symptoms that persist more than two weeks      Notes:  This document was synthesized by artificial intelligence (Abridge) using HIPAA-compliant recording of the clinical interaction;   We discussed the use of AI scribe software for clinical note transcription with the patient, who gave verbal consent to proceed.    This encounter employed state-of-the-art, real-time, collaborative documentation. The patient was empowered to actively review and assist in updating their electronic medical record on a shared monitor, ensuring transparency and improving accuracy.    Prior to and at the beginning of Comprehensive Physical Exam (CPE) preventive care annual visit appointment types  we clarify to patients Our goal today is to focus on your preventive or annual Comprehensive Physical Exam (CPE) preventive care annual visit, which typically covers routine screenings and overall health maintenance. However, if you share any new or concerning symptoms--such as dizziness, passing out, severe pain, or anything else that may point to a more serious issue--we are both legally and ethically  required to evaluate it. We cannot simply overlook or ignore such concerns, even if you later decide you don't want to discuss them, because it could jeopardize your health.  If addressing a new concern takes us  beyond the scope of the preventive visit, we may need to bill separately for that portion of care. We understand financial considerations are important, and we're happy to discuss your options if something new comes up. However, we want to be clear that once you mention a potentially serious issue,  we must investigate it; we can't ethically or legally exclude that from our records or our evaluation. Please let us  know all of your questions or worries. Together, we can decide how best to manage them and how to minimize any unexpected costs, but we want to keep you safe above all else.   This disclosure is mandated by professional ethics and legal obligations, as healthcare providers must address any substantial health concerns raised during any patient interaction and a comprehensive ROS is required by insurance companies for billing preventive-care visit type.   This disclosure ultimately discourages patients financially from reporting significant health issues.   Medical Screening Exam A medical screening exam (MSE) helps to determine whether you need immediate medical treatment relating to any number of symptoms you are having. This type of exam may be done in an emergency department, an urgent care setting, or your health care provider's office. Depending on your symptoms and severity, you may need additional tests or medical therapy. It is important to note that an MSE does not necessarily mean that you will need or receive further medical testing or interventions if your symptoms are not deemed to be medically urgent (emergent). Tell a health care provider about: Any allergies you have. All medicines you are taking, including vitamins, herbs, eye drops, creams, and over-the-counter  medicines. Any problems you or family members have had with anesthetic medicines. Any bleeding problems you have. Any surgeries you have had. Any medical conditions you have. Whether you are pregnant or may be pregnant. What happens during the test? During the exam, a health care provider does a short, often focused, physical exam and asks about your medical history to assess: Your current symptoms. Your overall health. Your need for possible further medical intervention. What can I expect after the test? If you have a regular health care provider, make an appointment for a follow-up visit with him or her. If you do not have a regular health care provider, ask about resources in your community. Your medical screening exam may determine that: You do not need emergency treatment at this time. You need treatment right away. You need to be transferred to another medical center. This may happen if you need an emergent specialist or consultant that is not available at the medical center you are at. You need to have more tests. A medical specialist may be consulted if needed. Get help right away if: Your condition gets worse. You develop new or troubling symptoms before you see your health care provider. These symptoms may represent a serious problem that is an emergency. Do not wait to see if the symptoms will go away. Get medical help right away. Call your local emergency services (911 in the [place] Do not drive yourself to the hospital. Summary A medical screening exam helps to determine whether you need medical treatment right away. This type of exam may be done in an emergency department, an urgent care setting, or your health care provider's office. During the exam, a health care provider does a short physical exam and asks about your current symptoms and overall health. Depending on the exam, more tests or therapies may be ordered. However, an MSE does not necessarily mean that you will  have further medical testing if your symptoms are not deemed to be urgent. If you need further care that is not offered at your current medical center, you may need to be transferred to another facility. This information is not intended to replace advice given to  you by your health care provider. Make sure you discuss any questions you have with your health care provider. Document Revised: 03/21/2021 Document Reviewed: 11/16/2020 Elsevier Patient Education  2024 Elsevier Inc.   Health Maintenance, Male Adopting a healthy lifestyle and getting preventive care are important in promoting health and wellness. Ask your health care provider about: The right schedule for you to have regular tests and exams. Things you can do on your own to prevent diseases and keep yourself healthy. What should I know about diet, weight, and exercise? Eat a healthy diet  Eat a diet that includes plenty of vegetables, fruits, low-fat dairy products, and lean protein. Do not eat a lot of foods that are high in solid fats, added sugars, or sodium. Maintain a healthy weight Body mass index (BMI) is a measurement that can be used to identify possible weight problems. It estimates body fat based on height and weight. Your health care provider can help determine your BMI and help you achieve or maintain a healthy weight. Get regular exercise Get regular exercise. This is one of the most important things you can do for your health. Most adults should: Exercise for at least 150 minutes each week. The exercise should increase your heart rate and make you sweat (moderate-intensity exercise). Do strengthening exercises at least twice a week. This is in addition to the moderate-intensity exercise. Spend less time sitting. Even light physical activity can be beneficial. Watch cholesterol and blood lipids Have your blood tested for lipids and cholesterol at 56 years of age, then have this test every 5 years. You may need to have  your cholesterol levels checked more often if: Your lipid or cholesterol levels are high. You are older than 56 years of age. You are at high risk for heart disease. What should I know about cancer screening? Many types of cancers can be detected early and may often be prevented. Depending on your health history and family history, you may need to have cancer screening at various ages. This may include screening for: Colorectal cancer. Prostate cancer. Skin cancer. Lung cancer. What should I know about heart disease, diabetes, and high blood pressure? Blood pressure and heart disease High blood pressure causes heart disease and increases the risk of stroke. This is more likely to develop in people who have high blood pressure readings or are overweight. Talk with your health care provider about your target blood pressure readings. Have your blood pressure checked: Every 3-5 years if you are 76-51 years of age. Every year if you are 31 years old or older. If you are between the ages of 69 and 23 and are a current or former smoker, ask your health care provider if you should have a one-time screening for abdominal aortic aneurysm (AAA). Diabetes Have regular diabetes screenings. This checks your fasting blood sugar level. Have the screening done: Once every three years after age 76 if you are at a normal weight and have a low risk for diabetes. More often and at a younger age if you are overweight or have a high risk for diabetes. What should I know about preventing infection? Hepatitis B If you have a higher risk for hepatitis B, you should be screened for this virus. Talk with your health care provider to find out if you are at risk for hepatitis B infection. Hepatitis C Blood testing is recommended for: Everyone born from [birth year] through 1963. Anyone with known risk factors for hepatitis C. Sexually transmitted infections (STIs) You  should be screened each year for STIs, including  gonorrhea and chlamydia, if: You are sexually active and are younger than 56 years of age. You are older than 56 years of age and your health care provider tells you that you are at risk for this type of infection. Your sexual activity has changed since you were last screened, and you are at increased risk for chlamydia or gonorrhea. Ask your health care provider if you are at risk. Ask your health care provider about whether you are at high risk for HIV. Your health care provider may recommend a prescription medicine to help prevent HIV infection. If you choose to take medicine to prevent HIV, you should first get tested for HIV. You should then be tested every 3 months for as long as you are taking the medicine. Follow these instructions at home: Alcohol use Do not drink alcohol if your health care provider tells you not to drink. If you drink alcohol: Limit how much you have to 0-2 drinks a day. Know how much alcohol is in your drink. In the [place] one drink equals one 12 oz bottle of beer (355 mL), one 5 oz glass of wine (148 mL), or one 1 oz glass of hard liquor (44 mL). Lifestyle Do not use any products that contain nicotine or tobacco. These products include cigarettes, chewing tobacco, and vaping devices, such as e-cigarettes. If you need help quitting, ask your health care provider. Do not use street drugs. Do not share needles. Ask your health care provider for help if you need support or information about quitting drugs. General instructions Schedule regular health, dental, and eye exams. Stay current with your vaccines. Tell your health care provider if: You often feel depressed. You have ever been abused or do not feel safe at home. Summary Adopting a healthy lifestyle and getting preventive care are important in promoting health and wellness. Follow your health care provider's instructions about healthy diet, exercising, and getting tested or screened for diseases. Follow your  health care provider's instructions on monitoring your cholesterol and blood pressure. This information is not intended to replace advice given to you by your health care provider. Make sure you discuss any questions you have with your health care provider. Document Revised: 11/27/2020 Document Reviewed: 11/27/2020 Elsevier Patient Education  2024 Elsevier Inc.     Coordination of obstructive sleep apnea medical necessity of Zepbound  with prior authorization team:  Clinical Documentation for Severity of OSA and Medical Necessity for Zepbound  (Tirzepatide )   Diagnosis & Severity   Obstructive Sleep Apnea (OSA) confirmed by sleep study.   OSA is severe and refractory: Despite use of CPAP and multiple mask types (nasal pads, full face), patient continues to experience significant symptoms.   Persistent symptoms despite adherence: Patient reports ongoing drowsiness, daytime lethargy, and cognitive impairment even with CPAP therapy.   Failed Standard Therapy   CPAP therapy is insufficient: Patient has attempted various mask types; currently uses nasal pads but still experiences mask leakage and suboptimal effectiveness.   Patient wakes up gasping for air 1-2 times nightly. This is described as "terrifying," with a sense of impending death.   Daytime symptoms persist: Lethargy, unfocused, decreased productivity, impaired concentration and memory--all despite CPAP use.   CPAP adherence documented but inadequate symptom control.   Functional Impairment   Occupational impact: Patient works in [employer], requires high cognitive function, memory, and concentration. OSA-related symptoms significantly impair ability to perform job duties.   Quality of life: Hobbies (gardening, baking,  cooking) are disrupted due to fatigue and low energy. Activities that previously provided balance and decompression now feel arduous.   Social and emotional impact: Patient reports anxiety and depression, is  prescribed Paxil  40 mg daily, indicating psychiatric comorbidity related to chronic sleep impairment.   Comorbidities & Complications   Hypertension: Patient is on two antihypertensive medications (metoprolol  25 mg, olmesartan 40 mg), indicating OSA-related hypertension.   Hyperlipidemia: On atorvastatin  10 mg.   Other medications: Amlodipine 5 mg, Celebrex 200 mg, Truvada, gabapentin, omeprazole 10 mg.   Blood pressure remains a concern despite treatment.   Increased cardiovascular risk: OSA is a well-established risk factor for hypertension, heart disease, and stroke.   Family history of dementia/Alzheimer's: Mother passed from dementia; patient expresses concern about cognitive decline and the potential for OSA to contribute.   Thick neck and small airway: Physical exam notes thick neck and small posterior airway, anatomical factors that worsen OSA and make CPAP less effective.   Other Considerations   Surgical risk: Thick neck increases risk and complexity of surgical interventions for OSA. Weight loss would improve candidacy and safety for surgery.   Sleep apnea contributes to depression: Patient takes Paxil  40 mg for depression, which is likely exacerbated by chronic sleep fragmentation.   OSA increases risk for dementia: Patient is at high risk due to both family history and inadequately treated OSA.   Rationale for Tirzepatide  (Zepbound )   Weight loss is critical: Patient's thick neck and obesity contribute to OSA severity and CPAP failure. Weight loss would reduce airway obstruction, improve CPAP effectiveness, and lower surgical risk.   Tirzepatide  offers dual benefit: Significant weight loss and potential improvement in glycemic control, which may further reduce cardiovascular risk.   Other avenues blocked: Compounding pharmacies and [place] importation are now illegal/unavailable; insurance has denied coverage for less expensive alternatives.   Surgical intervention is  less desirable: More invasive, higher risk, and costlier compared to medication.   Tirzepatide  may reduce risk of dementia: Emerging evidence suggests GLP-1 agonists may reverse or prevent Alzheimer's-type dementia, particularly relevant given family history and current cognitive symptoms.   Patient has exhausted all other options: CPAP, mask changes, and lifestyle modifications have failed. Medication is medically necessary to prevent further decline and comorbidities.   Insurance History & Appeal   Multiple appeals denied: Initial and first-level appeals submitted with sleep study evidence were rejected by pharmacy technician, not a physician.   No medical review: Denials were made by non-clinicians, with no consideration of medical necessity or alternative interventions.      Suggested Documentation Language for Prior Authorization/Appeal   Patient is a 58 year old individual with severe, refractory obstructive sleep apnea (OSA) confirmed by sleep study. Despite optimal adherence to CPAP therapy, including multiple mask types and ongoing adjustments, the patient continues to experience significant nocturnal and daytime symptoms, including awakening gasping for air, persistent daytime fatigue, impaired concentration and memory, and decreased occupational performance.   The patient's OSA has led to multiple comorbid conditions, including hypertension requiring two antihypertensive agents, hyperlipidemia, and depression requiring pharmacologic management (Paxil  40 mg). Physical examination reveals a thick neck and small airway, further complicating CPAP efficacy and increasing surgical risk.   OSA has resulted in profound functional impairment, affecting the patient's career, quality of life, and ability to engage in restorative activities. The patient also has a family history of dementia/Alzheimer's, and emerging evidence supports the role of GLP-1 agonists in reducing cognitive decline.    Attempts to obtain alternative therapies have been blocked  by regulatory and insurance barriers. Surgical intervention is high-risk and less desirable given anatomical factors. Weight loss is medically necessary to reduce OSA severity, improve CPAP effectiveness, and lower comorbidity risks. Tirzepatide  (Zepbound ) is indicated as the only remaining effective option.   Multiple appeals have been denied by non-clinicians without medical review, despite clear documentation of medical necessity. Further delay places the patient at risk for worsening cardiovascular, cognitive, and psychiatric outcomes. Approval of tirzepatide  is medically necessary and urgent.      Key Clinical Points for Appeal/Letter   Severe, refractory OSA with failed CPAP.   Multiple comorbidities: hypertension (2 meds), hyperlipidemia, depression (Paxil  40 mg).   Significant occupational and functional impairment.   Family history of dementia; increased risk due to untreated OSA.   Anatomical factors (thick neck, small airway) worsen OSA and surgical risk.   Weight loss is medically necessary; tirzepatide  is the only viable option.   All other avenues exhausted; insurance and regulatory barriers prevent alternatives.   Denials made by non-clinicians without proper review.   Patient at risk for serious, irreversible health decline if untreated.

## 2024-04-20 NOTE — Assessment & Plan Note (Signed)
 Anxiety and depression are managed with Paxil  40 mg daily. Sleep apnea may contribute to these conditions, and inadequate treatment of sleep apnea could worsen anxiety and depression.

## 2024-04-21 ENCOUNTER — Ambulatory Visit: Payer: Self-pay | Admitting: Internal Medicine

## 2024-04-21 LAB — URINE CYTOLOGY ANCILLARY ONLY
Chlamydia: NEGATIVE
Comment: NEGATIVE
Comment: NORMAL
Neisseria Gonorrhea: NEGATIVE

## 2024-04-21 LAB — HIV ANTIBODY (ROUTINE TESTING W REFLEX)
HIV 1&2 Ab, 4th Generation: NONREACTIVE
HIV FINAL INTERPRETATION: NEGATIVE

## 2024-04-21 LAB — HCV INTERPRETATION

## 2024-04-21 LAB — TSH RFX ON ABNORMAL TO FREE T4: TSH: 2.03 u[IU]/mL (ref 0.450–4.500)

## 2024-04-21 LAB — HCV AB W REFLEX TO QUANT PCR: HCV Ab: NONREACTIVE

## 2024-04-21 NOTE — Progress Notes (Signed)
 Reviewed, all essentially normal except cholesterol.  Message shared to patient.

## 2024-04-22 ENCOUNTER — Other Ambulatory Visit: Payer: Self-pay

## 2024-04-22 ENCOUNTER — Other Ambulatory Visit (HOSPITAL_COMMUNITY): Payer: Self-pay

## 2024-04-22 ENCOUNTER — Telehealth: Payer: Self-pay | Admitting: Pharmacist

## 2024-04-22 DIAGNOSIS — Z1211 Encounter for screening for malignant neoplasm of colon: Secondary | ICD-10-CM

## 2024-04-22 NOTE — Telephone Encounter (Signed)
 External review request has been submitted. Will advise when response is received, please be advised that most companies may take 45 days to make a decision. All documentation was faxed to [phone] on 04/22/2024 @8 :02 am.  Thank you, Devere Pandy, PharmD Clinical Pharmacist  [facility]  Direct Dial: [phone]

## 2024-04-22 NOTE — Telephone Encounter (Signed)
 Request for external review was received, full letter can be found under the media tab.

## 2024-04-23 NOTE — Telephone Encounter (Signed)
 Full letter can be found under the media tab.  Per insurance:

## 2024-04-27 ENCOUNTER — Other Ambulatory Visit

## 2024-05-03 ENCOUNTER — Telehealth: Payer: Self-pay

## 2024-05-03 NOTE — Telephone Encounter (Signed)
 Copied from CRM #[id number]. Topic: General - Other >> Apr 30, 2024  3:26 PM Rosina BIRCH wrote: Reason for CRM: Heidi from [employer] called wanting to speak to the cma about a non covered ICD 10 code [phone] Case# [id number]  Sent in new order

## 2024-05-04 ENCOUNTER — Telehealth: Payer: Self-pay

## 2024-05-04 ENCOUNTER — Other Ambulatory Visit: Payer: Self-pay

## 2024-05-04 DIAGNOSIS — Z1211 Encounter for screening for malignant neoplasm of colon: Secondary | ICD-10-CM

## 2024-05-04 NOTE — Telephone Encounter (Signed)
 Copied from CRM [id number]. Topic: General - Other >> May 04, 2024  9:50 AM Tinnie BROCKS wrote: Reason for CRM: Lonell from [employer]: Cologuard order was received with non-covered icd-10 code, not to be used with colon cancer screening, wants to know how to proceed. Ph: [phone]

## 2024-05-11 ENCOUNTER — Ambulatory Visit: Admitting: Family Medicine

## 2024-05-11 ENCOUNTER — Ambulatory Visit: Payer: Self-pay | Admitting: Family Medicine

## 2024-05-11 ENCOUNTER — Encounter: Payer: Self-pay | Admitting: Family Medicine

## 2024-05-11 ENCOUNTER — Ambulatory Visit (INDEPENDENT_AMBULATORY_CARE_PROVIDER_SITE_OTHER)

## 2024-05-11 VITALS — BP 128/86 | HR 70 | Temp 98.1°F | Ht 71.5 in | Wt 285.2 lb

## 2024-05-11 DIAGNOSIS — M25562 Pain in left knee: Secondary | ICD-10-CM

## 2024-05-11 DIAGNOSIS — Z23 Encounter for immunization: Secondary | ICD-10-CM | POA: Diagnosis not present

## 2024-05-11 MED ORDER — MELOXICAM 15 MG PO TABS: 15.0000 mg | ORAL_TABLET | Freq: Every day | ORAL | 0 refills | Status: DC

## 2024-05-11 NOTE — Progress Notes (Signed)
 Great news!  X-ray does not show any fracture.  He does have a small amount of arthritis.  As we discussed at his office visit I anticipate that his pain should continue to improve over the next several days.  He should let me or his PCP know if pain does not improve with the treatment plan we discussed at his office visit.

## 2024-05-11 NOTE — Addendum Note (Signed)
 Addended by: IDA ELORA HERO on: 05/11/2024 10:56 AM   Modules accepted: Orders

## 2024-05-11 NOTE — Progress Notes (Signed)
   Samuel Patel is a 58 y.o. male who presents today for an office visit.  Assessment/Plan:  Left Knee Pain Exam notable for mild joint swelling and ecchymosis on medial joint line of left knee.  Likely has a contusion however given his degree of pain and mechanism of injury need to obtain plain film to rule out fracture.  Will start meloxicam to help with the pain and swelling.  Also recommended ice and compression.  If x-ray is positive for fracture will need to be referred to orthopedics.  We discussed reasons to return to care.  Follow-up as needed.   Flu shot given today.     Subjective:  HPI:  See assessment / plan for status of chronic conditions.   Discussed the use of AI scribe software for clinical note transcription with the patient, who gave verbal consent to proceed.  History of Present Illness Samuel Patel is a 58 year old male who presents with left knee pain and numbness following a fall.  He fell one week ago, landing directly on his knee, resulting in a significant abrasion approximately an inch and a half in size. The skin was peeled off, but there was no bleeding. Initially, the knee was swollen.  He experiences numbness in the area, describing it as a sensation where he knows he is being touched but cannot feel it. He also reports sharp pain when touching a specific area of the knee. He can move the knee, but it feels as if he is 'going over an open wound' when he does so. The knee feels stiff when moving it.  He initially tried ibuprofen or Tylenol  for pain relief but decided to 'walk it off,' thinking it would improve. However, the numbness and pain have persisted.  He also mentions that his ankle was affected during the fall, but the primary concern is the knee.  No bleeding from the abrasion.         Objective:  Physical Exam: BP 128/86   Pulse 70   Temp 98.1 F (36.7 C) (Temporal)   Ht 5' 11.5 (1.816 m)   Wt 285 lb 3.2 oz (129.4 kg)   SpO2 99%    BMI 39.22 kg/m   Gen: No acute distress, resting comfortably MUSCULOSKELETAL Left leg: Ecchymosis noted on medial aspect of left knee.  Joint line extremely tender to palpation.  Patella very tender to palpation.  Full range of motion without crepitus or pain.  Small joint effusion noted.  Stable to varus and valgus stress.  Anterior posterior drawer signs negative.  Neurovascular intact distally. Neuro: Grossly normal, moves all extremities Psych: Normal affect and thought content      Kawthar Ennen M. Kennyth, MD 05/11/2024 10:42 AM

## 2024-05-11 NOTE — Patient Instructions (Signed)
 It was very nice to see you today!  VISIT SUMMARY: You visited us  today due to knee pain and numbness following a fall a week ago. You have a contusion on your right knee with some numbness and pain.  YOUR PLAN: RIGHT KNEE CONTUSION WITH NUMBNESS AND PAIN: You have a bruise on your right knee with some numbness and pain, likely due to inflammation affecting the nerves. A fracture is unlikely but needs to be ruled out. -We will order an x-ray of your right knee to check for any fractures. -If you can tolerate it, apply compression with an ACE wrap to your knee. -Take anti-inflammatory medication for 1-2 weeks to help with the pain and swelling. -If the x-ray shows a fracture, we will refer you to an orthopedist.  Return if symptoms worsen or fail to improve.   Take care, Dr Kennyth  PLEASE NOTE:  If you had any lab tests, please let us  know if you have not heard back within a few days. You may see your results on mychart before we have a chance to review them but we will give you a call once they are reviewed by us .   If we ordered any referrals today, please let us  know if you have not heard from their office within the next week.   If you had any urgent prescriptions sent in today, please check with the pharmacy within an hour of our visit to make sure the prescription was transmitted appropriately.   Please try these tips to maintain a healthy lifestyle:  Eat at least 3 REAL meals and 1-2 snacks per day.  Aim for no more than 5 hours between eating.  If you eat breakfast, please do so within one hour of getting up.   Each meal should contain half fruits/vegetables, one quarter protein, and one quarter carbs (no bigger than a computer mouse)  Cut down on sweet beverages. This includes juice, soda, and sweet tea.   Drink at least 1 glass of water with each meal and aim for at least 8 glasses per day  Exercise at least 150 minutes every week.

## 2024-05-13 ENCOUNTER — Encounter: Payer: Self-pay | Admitting: Internal Medicine

## 2024-05-13 DIAGNOSIS — Z1211 Encounter for screening for malignant neoplasm of colon: Secondary | ICD-10-CM

## 2024-05-13 DIAGNOSIS — F419 Anxiety disorder, unspecified: Secondary | ICD-10-CM

## 2024-05-13 DIAGNOSIS — I10 Essential (primary) hypertension: Secondary | ICD-10-CM

## 2024-05-13 DIAGNOSIS — Z7251 High risk heterosexual behavior: Secondary | ICD-10-CM

## 2024-05-17 ENCOUNTER — Other Ambulatory Visit: Payer: Self-pay

## 2024-05-17 DIAGNOSIS — Z1211 Encounter for screening for malignant neoplasm of colon: Secondary | ICD-10-CM

## 2024-05-18 NOTE — Telephone Encounter (Signed)
Review and advise. 

## 2024-05-19 MED ORDER — AMLODIPINE BESYLATE 5 MG PO TABS: 5.0000 mg | ORAL_TABLET | Freq: Every day | ORAL | 4 refills | Status: AC

## 2024-05-19 MED ORDER — EMTRICITABINE-TENOFOVIR DF 200-300 MG PO TABS
1.0000 | ORAL_TABLET | Freq: Every day | ORAL | 1 refills | Status: AC
Start: 2024-05-19 — End: ?

## 2024-05-19 MED ORDER — PAROXETINE HCL 40 MG PO TABS: 40.0000 mg | ORAL_TABLET | ORAL | 4 refills | Status: AC

## 2024-05-24 ENCOUNTER — Other Ambulatory Visit: Payer: Self-pay | Admitting: Medical Genetics

## 2024-05-24 DIAGNOSIS — Z006 Encounter for examination for normal comparison and control in clinical research program: Secondary | ICD-10-CM

## 2024-05-25 MED ORDER — OLMESARTAN MEDOXOMIL 40 MG PO TABS: 40.0000 mg | ORAL_TABLET | Freq: Every day | ORAL | 4 refills | Status: DC

## 2024-05-25 NOTE — Addendum Note (Signed)
 Addended by: Huntleigh Doolen G on: 05/25/2024 08:14 PM   Modules accepted: Orders

## 2024-05-26 ENCOUNTER — Telehealth: Payer: Self-pay

## 2024-05-26 NOTE — Telephone Encounter (Signed)
 SABRA

## 2024-05-28 ENCOUNTER — Other Ambulatory Visit: Payer: Self-pay | Admitting: Physician Assistant

## 2024-05-29 ENCOUNTER — Other Ambulatory Visit (HOSPITAL_COMMUNITY): Payer: Self-pay

## 2024-05-30 NOTE — Progress Notes (Unsigned)
 SLEEP MEDICINE OFFICE VISIT NOTE:   Date:  05/30/2024   ID:  Samuel Patel, DOB Jun 03, 1966, MRN [id number] The patient was identified using 2 identifiers. PCP:  Jesus Bernardino MATSU, MD  Cardiologist:  Wilbert Bihari, MD    Referring MD: Halford Dorena SAILOR, MD   No chief complaint on file.   History of Present Illness:    Samuel Patel is a 58 y.o. male with a hx of anxiety, asthma, PAF, depression, HTN and OSA.  He had been followed by Dr. Burnard in the past and is now referred to establish care with me due to retirement of Dr. Burnard.   He underwent a sleep study for poor sleep and loud snoring and was found to have severe OSA with an AHI of 90.0/hr in 2023.  O2 sat nadir was 65%.  Insurance denied an in lab study so he was started on ResMed Airsense 11 auto CPAP from 8 to 20cm H2O.  Due to ongoing events his pressure was increased to a range of 13-20cm H2O and ultimately to 17-20cm H2O.    He is doing well with his PAP device and thinks that he has gotten used to it.  He tolerates the mask and feels the pressure is adequate.  Since going on PAP he feels rested in the am and has no significant daytime sleepiness.  He denies any significant mouth or nasal dryness or nasal congestion.  He does not think that he snores. An Epworth Sleepiness Scale score was calculated the office today and this endorsed at ### arguing against residual daytime sleepiness. Patient denies any episodes of bruxism, restless legs, No gagging hallucinations or cataplectic events.    Past Medical History:  Diagnosis Date   Anxiety    Asthma    Atrial fibrillation (HCC)    Atrial fibrillation with rapid ventricular response (HCC) 05/02/2014   Depression    Elevated cholesterol    Hypertension    OSA (obstructive sleep apnea)    Sleep apnea     Past Surgical History:  Procedure Laterality Date   APPENDECTOMY     CHOLECYSTECTOMY     TOTAL SHOULDER REPLACEMENT Left    3/4 years ago    Current Medications: No  outpatient medications have been marked as taking for the 05/31/24 encounter (Appointment) with Bihari Wilbert SAUNDERS, MD.     Allergies:   Penicillins   Social History   Socioeconomic History   Marital status: Divorced    Spouse name: Not on file   Number of children: 1   Years of education: Not on file   Highest education level: Not on file  Occupational History   Not on file  Tobacco Use   Smoking status: Every Day    Current packs/day: 0.25    Average packs/day: 0.3 packs/day for 0.9 years (0.2 ttl pk-yrs)    Types: Cigarettes    Start date: 2025   Smokeless tobacco: Never  Substance and Sexual Activity   Alcohol use: Yes    Comment: occassionally   Drug use: No   Sexual activity: Yes  Other Topics Concern   Not on file  Social History Narrative   One daughter that is 63years old   Social Drivers of Corporate Investment Banker Strain: Not on file  Food Insecurity: Not on file  Transportation Needs: Not on file  Physical Activity: Not on file  Stress: Not on file  Social Connections: Unknown (12/01/2021)   Received from [facility]   Social  Network    Social Network: Not on file     Family History: The patient's family history includes Asthma in his mother; Cancer in his father; Depression in his brother, daughter, and sister; Diabetes in his mother; Hyperlipidemia in his mother; Hypertension in his mother.  ROS:   Please see the history of present illness.    ROS  All other systems reviewed and negative.   EKGs/Labs/Other Studies Reviewed:    The following studies were reviewed today: PAP compliance download   Physical Exam:    VS:  There were no vitals taken for this visit.    Wt Readings from Last 3 Encounters:  05/11/24 285 lb 3.2 oz (129.4 kg)  04/20/24 287 lb (130.2 kg)  03/16/24 284 lb 12.8 oz (129.2 kg)    GEN: Well nourished, well developed in no acute distress HEENT: Normal NECK: No JVD; No carotid bruits LYMPHATICS: No  lymphadenopathy CARDIAC:RRR, no murmurs, rubs, gallops RESPIRATORY:  Clear to auscultation without rales, wheezing or rhonchi  ABDOMEN: Soft, non-tender, non-distended MUSCULOSKELETAL:  No edema; No deformity  SKIN: Warm and dry NEUROLOGIC:  Alert and oriented x 3 PSYCHIATRIC:  Normal affect   ASSESSMENT:    No diagnosis found. PLAN:    In order of problems listed above:  OSA - The patient is tolerating PAP therapy well without any problems. The PAP download performed by his DME was personally reviewed and interpreted by me today and showed an AHI of ***/hr on *** cm H2O with ***% compliance in using more than 4 hours nightly.  The patient has been using and benefiting from PAP use and will continue to benefit from therapy.   HTN -BP controlled on exam today -continue Amlodipine 5mg  dialy, Toprol  XL 25mg  daily and Olmesartan 40mg  daily with PRN refills    Time Spent: 20 minutes total time of encounter, including 15 minutes spent in face-to-face patient care on the date of this encounter. This time includes coordination of care and counseling regarding above mentioned problem list. Remainder of non-face-to-face time involved reviewing chart documents/testing relevant to the patient encounter and documentation in the medical record. I have independently reviewed documentation from referring provider  Medication Adjustments/Labs and Tests Ordered: Current medicines are reviewed at length with the patient today.  Concerns regarding medicines are outlined above.  No orders of the defined types were placed in this encounter.  No orders of the defined types were placed in this encounter.   Signed, Wilbert Bihari, MD  05/30/2024 2:58 PM    [facility]

## 2024-05-31 ENCOUNTER — Ambulatory Visit: Admitting: Cardiology

## 2024-05-31 DIAGNOSIS — G4733 Obstructive sleep apnea (adult) (pediatric): Secondary | ICD-10-CM

## 2024-05-31 DIAGNOSIS — I10 Essential (primary) hypertension: Secondary | ICD-10-CM

## 2024-06-01 ENCOUNTER — Encounter: Payer: Self-pay | Admitting: Cardiology

## 2024-06-01 ENCOUNTER — Other Ambulatory Visit (HOSPITAL_COMMUNITY): Payer: Self-pay

## 2024-06-03 ENCOUNTER — Telehealth: Payer: Self-pay

## 2024-06-04 LAB — COLOGUARD: COLOGUARD: NEGATIVE

## 2024-06-06 ENCOUNTER — Ambulatory Visit: Payer: Self-pay | Admitting: Internal Medicine

## 2024-06-06 NOTE — Progress Notes (Signed)
 Key Points for Patient Follow-Up Call:  Test Results Summary:    Test: Cologuard (stool DNA test for colorectal cancer screening)    Result: Negative    Significance: 99.94% certainty of no colorectal cancer present  Clinical Interpretation:    Negative result is reassuring but not a 100% guarantee    Consistent with current health status and absence of reported symptoms  Patient Instructions:    No immediate actions required based on this result    Continue current lifestyle habits, emphasizing healthy diet and regular exercise    Report any new digestive symptoms or changes in bowel habits promptly  Follow-up Plan:    Next Cologuard test due in 3 years (adjust if high-risk family history)    Routine check-ups as previously scheduled  Addressing Patient Concerns: If patient asks about other screening options: "While Cologuard is highly effective, Dr. Jon Billings can discuss alternative screening methods like colonoscopy at your next visit if you're interested." If patient inquires about earlier rescreening: "The 3-year interval is based on current guidelines. However, if you have concerns or new symptoms, please let us know immediately."  Next Steps: Confirm patient understands the result and follow-up plan Offer to schedule next routine appointment if needed Remind patient to reach out with any new concerns before next scheduled screening  Note: Patient education on maintaining colorectal health through diet, exercise, and prompt reporting of symptoms is crucial. Your role in reinforcing these points is greatly appreciated.

## 2024-06-07 ENCOUNTER — Telehealth: Payer: Self-pay | Admitting: *Deleted

## 2024-06-07 ENCOUNTER — Ambulatory Visit: Attending: Cardiology | Admitting: Cardiology

## 2024-06-07 ENCOUNTER — Encounter: Payer: Self-pay | Admitting: Cardiology

## 2024-06-07 VITALS — BP 136/78 | HR 78 | Ht 71.5 in | Wt 283.8 lb

## 2024-06-07 DIAGNOSIS — I10 Essential (primary) hypertension: Secondary | ICD-10-CM | POA: Diagnosis not present

## 2024-06-07 DIAGNOSIS — G4733 Obstructive sleep apnea (adult) (pediatric): Secondary | ICD-10-CM

## 2024-06-07 DIAGNOSIS — I480 Paroxysmal atrial fibrillation: Secondary | ICD-10-CM | POA: Diagnosis not present

## 2024-06-07 DIAGNOSIS — I4719 Other supraventricular tachycardia: Secondary | ICD-10-CM | POA: Diagnosis not present

## 2024-06-07 MED ORDER — OLMESARTAN MEDOXOMIL 40 MG PO TABS
40.0000 mg | ORAL_TABLET | Freq: Every day | ORAL | 3 refills | Status: AC
Start: 2024-06-07 — End: ?

## 2024-06-07 NOTE — Progress Notes (Signed)
 read by Marolyn Polite at 7:33AM on 06/07/2024.

## 2024-06-07 NOTE — Telephone Encounter (Addendum)
 Spoke with patient regarding upcoming consult appointment with Dr. Tobie (ENT). Per pt, this appointment is to discuss [facility]. He states he is aware that Dr. Shlomo is managing his CPAP and he wishes to continue following with her. Advised pt that I will provide Dr. Shlomo with this update and will call him back after with any recommendations.  Reviewed Dr. Justus (PCP) note - note mentions pursuing both tirzepatide  and surgical eval by ENT for OSA. Forwarding to Dr. Shlomo for review and recommendations.

## 2024-06-07 NOTE — Patient Instructions (Signed)
 Medication Instructions:  Your physician recommends that you continue on your current medications as directed. Please refer to the Current Medication list given to you today.   *If you need a refill on your cardiac medications before your next appointment, please call your pharmacy*  Testing/Procedures: Your physician has requested that you have an echocardiogram. Echocardiography is a painless test that uses sound waves to create images of your heart. It provides your doctor with information about the size and shape of your heart and how well your heart's chambers and valves are working. This procedure takes approximately one hour. There are no restrictions for this procedure. Please do NOT wear cologne, perfume, aftershave, or lotions (deodorant is allowed). Please arrive 15 minutes prior to your appointment time.  Please note: We ask at that you not bring children with you during ultrasound (echo/ vascular) testing. Due to room size and safety concerns, children are not allowed in the ultrasound rooms during exams. Our front office staff cannot provide observation of children in our lobby area while testing is being conducted. An adult accompanying a patient to their appointment will only be allowed in the ultrasound room at the discretion of the ultrasound technician under special circumstances. We apologize for any inconvenience.   Follow-Up: At [facility], you and your health needs are our priority.  As part of our continuing mission to provide you with exceptional heart care, our providers are all part of one team.  This team includes your primary Cardiologist (physician) and Advanced Practice Providers or APPs (Physician Assistants and Nurse Practitioners) who all work together to provide you with the care you need, when you need it.  Your next appointment:   1 year(s)  Provider:   Wilbert Bihari, MD    We recommend signing up for the patient portal called MyChart.  Sign up  information is provided on this After Visit Summary.  MyChart is used to connect with patients for Virtual Visits (Telemedicine).  Patients are able to view lab/test results, encounter notes, upcoming appointments, etc.  Non-urgent messages can be sent to your provider as well.   To learn more about what you can do with MyChart, go to [email or web address].

## 2024-06-07 NOTE — Progress Notes (Signed)
 SLEEP MEDICINE OFFICE VISIT NOTE:   Date:  06/07/2024   ID:  Samuel Patel, DOB 07/10/1966, MRN [id number]   PCP:  Samuel Bernardino MATSU, MD  Cardiologist:  Samuel Bihari, MD    Referring MD: Samuel Dorena SAILOR, MD   Chief Complaint  Patient presents with   Atrial Fibrillation   Sleep Apnea   Hypertension    History of Present Illness:    Samuel Patel is a 58 y.o. male with a hx of paroxysmal atrial fibrillation, hyperlipidemia, hypertension and obstructive sleep apnea on CPAP therapy.  The patient was followed by Dr. Burnard in the past for sleep apnea but is now referred to establish care with me upon [facility] retirement.  Patient has a history of obstructive sleep apnea that was initially diagnosed on sleep study done 4 loud snoring, awakening gasping for breath and nocturia 2-3 times per night with nonrestorative sleep.  Sleep study in 2023 found severe obstructive sleep apnea with an AHI of 90.9/h and severe O2 desaturations down to 65% with time spent with O2 sats less than 89% at 180 minutes.  Insurance denied an in-lab CPAP titration so he was started on auto CPAP from 8 to 20 cm H2O with an EPR of 3 cm H2O.  He received a ResMed AirSense 11 CPAP unit.  Pressure ultimately increased to 17-20 cm H2O after AHI was consistently elevated and patient feeling he was not getting enough air.  He saw Dr. Burnard a year ago and was doing well on his CPAP.  AHI was 1.8/h on an auto CPAP pressure of 17 to 20 cm H2O with 95th percentile pressure 19 cm H2O.  He was sleeping 7 to 9 hours with restorative sleep and significant improvement in daytime sleepiness also after losing some weight.  He is doing well with his PAP device and thinks that he has gotten used to it.  He tolerates the nasal pillow mask and feels the pressure is adequate.  Since going on PAP he feels rested in the am and has no significant daytime sleepiness.  He denies any significant mouth (when using the chin strap) or nasal dryness or nasal  congestion.  He no longer has nocturia. He does not think that he snores.   Past Medical History:  Diagnosis Date   Anxiety    Asthma    Atrial fibrillation (HCC)    Depression    Elevated cholesterol    Hypertension    OSA (obstructive sleep apnea)     Past Surgical History:  Procedure Laterality Date   APPENDECTOMY     CHOLECYSTECTOMY     TOTAL SHOULDER REPLACEMENT Left    3/4 years ago    Current Medications: Current Meds  Medication Sig   amLODipine (NORVASC) 5 MG tablet Take 1 tablet (5 mg total) by mouth daily.   atorvastatin  (LIPITOR) 10 MG tablet Take 10 mg by mouth daily.   celecoxib (CELEBREX) 200 MG capsule Take 200 mg by mouth daily.   emtricitabine-tenofovir (TRUVADA) 200-300 MG tablet Take 1 tablet by mouth daily.   metoprolol  succinate (TOPROL -XL) 25 MG 24 hr tablet Take 1 tablet (25 mg total) by mouth daily.   olmesartan (BENICAR) 40 MG tablet Take 1 tablet (40 mg total) by mouth daily.   PARoxetine  (PAXIL ) 40 MG tablet Take 1 tablet (40 mg total) by mouth every morning.     Allergies:   Penicillins   Social History   Socioeconomic History   Marital status: Divorced  Spouse name: Not on file   Number of children: 1   Years of education: Not on file   Highest education level: Not on file  Occupational History   Not on file  Tobacco Use   Smoking status: Every Day    Current packs/day: 0.25    Average packs/day: 0.3 packs/day for 0.9 years (0.2 ttl pk-yrs)    Types: Cigarettes    Start date: 2025   Smokeless tobacco: Never  Substance and Sexual Activity   Alcohol use: Yes    Comment: occassionally   Drug use: No   Sexual activity: Yes  Other Topics Concern   Not on file  Social History Narrative   One daughter that is 83years old   Social Drivers of Corporate Investment Banker Strain: Not on file  Food Insecurity: Not on file  Transportation Needs: Not on file  Physical Activity: Not on file  Stress: Not on file  Social Connections:  Not on file     Family History: The patient's family history includes Asthma in his mother; Cancer in his father; Depression in his brother, daughter, and sister; Diabetes in his mother; Hyperlipidemia in his mother; Hypertension in his mother.  ROS:   Please see the history of present illness.    ROS  All other systems reviewed and negative.   EKGs/Labs/Other Studies Reviewed:    The following studies were reviewed today: PAP download     Physical Exam:    VS:  BP 136/78 (BP Location: Left Arm, Patient Position: Sitting, Cuff Size: Large)   Pulse 78   Ht 5' 11.5 (1.816 m)   Wt 283 lb 12.8 oz (128.7 kg)   SpO2 98%   BMI 39.03 kg/m     Wt Readings from Last 3 Encounters:  06/07/24 283 lb 12.8 oz (128.7 kg)  05/11/24 285 lb 3.2 oz (129.4 kg)  04/20/24 287 lb (130.2 kg)      ASSESSMENT:    1. OSA (obstructive sleep apnea)   2. Primary hypertension   3. Paroxysmal atrial tachycardia   4. PAF (paroxysmal atrial fibrillation) (HCC)    PLAN:    In order of problems listed above:  OSA - The patient is tolerating PAP therapy well without any problems. The PAP download performed by his DME was personally reviewed and interpreted by me today and showed an AHI of 3.1 /hr on auto CPAP from 17-20 cm H2O with 97% compliance in using more than 4 hours nightly.  The patient has been using and benefiting from PAP use and will continue to benefit from therapy.   Hypertension - BP controlled on exam today - Continue amlodipine 5 mg daily, Toprol  XL 25 mg daily and olmesartan 40 mg daily with as needed refills -I have personally reviewed and interpreted outside labs performed by patient's PCP which showed serum creatinine 0.93 and potassium 4.2 on 04/20/2024  Paroxysmal atrial fibrillation/PAT/Palpitations - Initial A-fib episode was in 2015 and has not had any since then and was not placed on anticoagulation therapy at that time due to low CHADS2VASC score  of 1  -maintaining normal  sinus rhythm on exam -Heart monitor 03/19/2024 showed normal sinus rhythm with 23 episodes of paroxysmal atrial tachycardia lasting as long as 19 beats with rare PACs and PVCs and episode of possible NSVT lasting 8 beats. -his palpitations have subsided and occur very infrequently -He has not had a 2D echo since 2015 so we will check that -Continue Toprol -XL 25 mg daily  with as needed refills    Time Spent: 20 minutes total time of encounter, including 15 minutes spent in face-to-face patient care on the date of this encounter. This time includes coordination of care and counseling regarding above mentioned problem list. Remainder of non-face-to-face time involved reviewing chart documents/testing relevant to the patient encounter and documentation in the medical record. I have independently reviewed documentation from referring provider  Medication Adjustments/Labs and Tests Ordered: Current medicines are reviewed at length with the patient today.  Concerns regarding medicines are outlined above.  No orders of the defined types were placed in this encounter.  No orders of the defined types were placed in this encounter.  Follow-up 1 year  Signed, Samuel Bihari, MD  06/07/2024 10:07 AM    [facility]

## 2024-06-08 ENCOUNTER — Other Ambulatory Visit: Payer: Self-pay | Admitting: Family Medicine

## 2024-06-08 NOTE — Telephone Encounter (Addendum)
 Samuel Wilbert SAUNDERS, MD to Me   06/07/24  7:30 PM Ok to see ENT to see if any surgical reasons for OSA but he is doing well on CPAP otherwise so would not recommend Inspire at this time   Spoke with patient to relay Dr. Dorine recommendations about upcoming eval with ENT. Pt verbalizes understanding and states he will think about whether he wishes to proceed. He denies additional questions or concerns at this time.

## 2024-06-15 ENCOUNTER — Institutional Professional Consult (permissible substitution) (INDEPENDENT_AMBULATORY_CARE_PROVIDER_SITE_OTHER): Admitting: Otolaryngology

## 2024-06-15 NOTE — Progress Notes (Deleted)
 Dear Dr. Jesus, Here is my assessment for our mutual patient, Samuel Patel. Thank you for allowing me the opportunity to care for your patient. Please do not hesitate to contact me should you have any other questions. Sincerely, Dr. Eldora Blanch  Otolaryngology Clinic Note Referring provider: Dr. Jesus HPI:  Initial visit (05/2024): Discussed the use of AI scribe software for clinical note transcription with the patient, who gave verbal consent to proceed.  History of Present Illness     H&N Surgery: *** Personal or FHx of bleeding dz or anesthesia difficulty: no ***  GLP-1: *** AP/AC: ***  Tobacco: yes. Alcohol: ***. Occupation: ***. Lives in *** with ***.  PMHx: OSA, HLD, PreDM, HTN, A-fib, GAD  Independent Review of Additional Tests or Records:  Dr. Jesus (04/20/2024): noted OSA on CPAP, continued daytime symptoms; Trying to pursue Tirzepatide , consider surgical options.  Dr. Shlomo (06/07/2024): h/o OSA, on ResMed AirSense -- download shows residual AHI 3.1/hr on autopap with 97%  compliance in >4 hr use Sleep study (2023): reviewed, AHI 90, O2 nadir 65% CBC and CMP (04/20/2024): WBC 5.6, Hgb 14.3, Plt 210; BUN/Cr 13.0.93 PMH/Meds/All/SocHx/FamHx/ROS:   Past Medical History:  Diagnosis Date   Anxiety    Asthma    Atrial fibrillation (HCC)    Depression    Elevated cholesterol    Hypertension    OSA (obstructive sleep apnea)      Past Surgical History:  Procedure Laterality Date   APPENDECTOMY     CHOLECYSTECTOMY     TOTAL SHOULDER REPLACEMENT Left    3/4 years ago    Family History  Problem Relation Age of Onset   Diabetes Mother    Asthma Mother    Hyperlipidemia Mother    Hypertension Mother    Cancer Father    Depression Sister    Depression Brother    Depression Daughter      Social Connections: Not on file      Current Outpatient Medications:    amLODipine  (NORVASC ) 5 MG tablet, Take 1 tablet (5 mg total) by mouth daily., Disp: 90  tablet, Rfl: 4   atorvastatin  (LIPITOR) 10 MG tablet, Take 10 mg by mouth daily., Disp: , Rfl:    celecoxib (CELEBREX) 200 MG capsule, Take 200 mg by mouth daily., Disp: , Rfl:    emtricitabine -tenofovir  (TRUVADA) 200-300 MG tablet, Take 1 tablet by mouth daily., Disp: 90 tablet, Rfl: 1   metoprolol  succinate (TOPROL -XL) 25 MG 24 hr tablet, Take 1 tablet (25 mg total) by mouth daily., Disp: 90 tablet, Rfl: 3   olmesartan  (BENICAR ) 40 MG tablet, Take 1 tablet (40 mg total) by mouth daily., Disp: 90 tablet, Rfl: 3   PARoxetine  (PAXIL ) 40 MG tablet, Take 1 tablet (40 mg total) by mouth every morning., Disp: 90 tablet, Rfl: 4   Physical Exam:   There were no vitals taken for this visit.  Salient findings:  CN II-XII intact *** Bilateral EAC clear and TM intact with well pneumatized middle ear spaces Weber 512: *** Rinne 512: AC > BC b/l *** Rine 1024: AC > BC b/l *** Anterior rhinoscopy: Septum ***; bilateral inferior turbinates with *** No lesions of oral cavity/oropharynx; dentition *** No obviously palpable neck masses/lymphadenopathy/thyromegaly No respiratory distress or stridor***  Seprately Identifiable Procedures:  Prior to initiating any procedures, risks/benefits/alternatives were explained to the patient and verbal consent obtained. None***  Impression & Plans:  Samuel Patel is a 58 y.o. male with ***  No diagnosis found.   -  f/u ***  See below regarding exact medications prescribed this encounter including dosages and route: No orders of the defined types were placed in this encounter.     Thank you for allowing me the opportunity to care for your patient. Please do not hesitate to contact me should you have any other questions.  Sincerely, Eldora Blanch, MD Otolaryngologist (ENT), [facility] Phone: [phone] Fax: [phone]  06/15/2024, 7:41 AM   MDM:  Level *** Complexity/Problems addressed: *** Data complexity: *** independent review  of *** - Morbidity: ***  - Prescription Drug prescribed or managed: ***

## 2024-07-13 LAB — GENECONNECT MOLECULAR SCREEN: Genetic Analysis Overall Interpretation: NEGATIVE

## 2024-07-14 ENCOUNTER — Ambulatory Visit (HOSPITAL_COMMUNITY): Attending: Cardiovascular Disease

## 2024-07-14 ENCOUNTER — Telehealth (HOSPITAL_COMMUNITY): Payer: Self-pay | Admitting: Cardiology

## 2024-07-14 NOTE — Telephone Encounter (Signed)
 07/14/24 NO SHOWED echocardiogram- We will not reach out to reschedule due to NO SHOW RATE 22%. Order will be removed from the echo wq and if patient calls back we will reinstate the order. Thank you.

## 2024-07-19 NOTE — Telephone Encounter (Signed)
 Call to patient to discuss echocardiogram. 2 identifiers used. Patient acknowledges he got confused about date of echo and is willing to get it rescheduled. Gave phone number for patient to call and instructed patient to call back to schedule. Patient verbalizes understanding.

## 2024-07-20 ENCOUNTER — Encounter: Payer: Self-pay | Admitting: Internal Medicine

## 2024-07-20 ENCOUNTER — Other Ambulatory Visit: Payer: Self-pay

## 2024-07-20 DIAGNOSIS — M10071 Idiopathic gout, right ankle and foot: Secondary | ICD-10-CM

## 2024-07-20 MED ORDER — ATORVASTATIN CALCIUM 10 MG PO TABS: 10.0000 mg | ORAL_TABLET | Freq: Every day | ORAL | 2 refills | Status: AC

## 2024-08-02 ENCOUNTER — Ambulatory Visit: Payer: Self-pay

## 2024-08-02 NOTE — Telephone Encounter (Signed)
 FYI Only or Action Required?: FYI only for provider: appointment scheduled on 1/12.  Patient was last seen in primary care on 05/11/2024 by Kennyth Worth HERO, MD.  Called Nurse Triage reporting Back Pain.  Symptoms began several years ago.  Interventions attempted: Prescription medications: gabapentin.  Symptoms are: gradually worsening.  Triage Disposition: See PCP Within 2 Weeks  Patient/caregiver understands and will follow disposition?:   Reason for Disposition  Back pain present > 2 weeks  Answer Assessment - Initial Assessment Questions 1. ONSET: When did the pain begin? (e.g., minutes, hours, days)     Several years, worse in the last 2 weeks  2. LOCATION: Where does it hurt? (upper, mid or lower back)     Right lower side of back and a burning sensation down R hamstring  3. SEVERITY: How bad is the pain?  (e.g., Scale 1-10; mild, moderate, or severe)     5/10  4. PATTERN: Is the pain constant? (e.g., yes, no; constant, intermittent)      Constant in the last 2 weeks  5. RADIATION: Does the pain shoot into your legs or somewhere else?     Shoots into R hamstring  6. CAUSE:  What do you think is causing the back pain?      Pt reports he has ruptured discs  7. BACK OVERUSE:  Any recent lifting of heavy objects, strenuous work or exercise?     Denies  8. MEDICINES: What have you taken so far for the pain? (e.g., nothing, acetaminophen , NSAIDS)     900mg  gabapentin  9. NEUROLOGIC SYMPTOMS: Do you have any weakness, numbness, or problems with bowel/bladder control?     Denies  10. OTHER SYMPTOMS: Do you have any other symptoms? (e.g., fever, abdomen pain, burning with urination, blood in urine)       Denies  Protocols used: Back Pain-A-AH

## 2024-08-03 MED ORDER — CELECOXIB 200 MG PO CAPS: 200.0000 mg | ORAL_CAPSULE | Freq: Every day | ORAL | 4 refills | Status: DC

## 2024-08-05 ENCOUNTER — Encounter: Payer: Self-pay | Admitting: Internal Medicine

## 2024-08-05 ENCOUNTER — Ambulatory Visit: Payer: Self-pay | Admitting: Internal Medicine

## 2024-08-05 ENCOUNTER — Ambulatory Visit: Admitting: Internal Medicine

## 2024-08-05 VITALS — BP 136/72 | HR 72 | Temp 98.0°F | Ht 71.5 in | Wt 276.2 lb

## 2024-08-05 DIAGNOSIS — M546 Pain in thoracic spine: Secondary | ICD-10-CM | POA: Diagnosis not present

## 2024-08-05 DIAGNOSIS — M5416 Radiculopathy, lumbar region: Secondary | ICD-10-CM

## 2024-08-05 DIAGNOSIS — G8929 Other chronic pain: Secondary | ICD-10-CM | POA: Diagnosis not present

## 2024-08-05 LAB — BASIC METABOLIC PANEL WITH GFR
BUN: 12 mg/dL (ref 6–23)
CO2: 29 meq/L (ref 19–32)
Calcium: 9.5 mg/dL (ref 8.4–10.5)
Chloride: 104 meq/L (ref 96–112)
Creatinine, Ser: 1.18 mg/dL (ref 0.40–1.50)
GFR: 68.97 mL/min
Glucose, Bld: 94 mg/dL (ref 70–99)
Potassium: 4.6 meq/L (ref 3.5–5.1)
Sodium: 137 meq/L (ref 135–145)

## 2024-08-05 MED ORDER — TIZANIDINE HCL 4 MG PO TABS: 4.0000 mg | ORAL_TABLET | Freq: Four times a day (QID) | ORAL | 2 refills | Status: DC | PRN

## 2024-08-05 MED ORDER — JOURNAVX 50 MG PO TABS: 1.0000 | ORAL_TABLET | Freq: Two times a day (BID) | ORAL | 5 refills | Status: AC | PRN

## 2024-08-05 MED ORDER — CELECOXIB 200 MG PO CAPS: 200.0000 mg | ORAL_CAPSULE | Freq: Two times a day (BID) | ORAL | 4 refills | Status: AC

## 2024-08-05 MED ORDER — PREGABALIN 200 MG PO CAPS: 200.0000 mg | ORAL_CAPSULE | Freq: Two times a day (BID) | ORAL | 2 refills | Status: AC

## 2024-08-05 NOTE — Patient Instructions (Signed)
 It was a pleasure seeing you today! Your health and satisfaction are our top priorities.  Samuel Cone, MD  VISIT SUMMARY: During your visit, we discussed your worsening back pain and neuropathic symptoms. You have been experiencing a burning sensation and achy feeling in your back and leg, which has not been relieved by previous treatments. We reviewed your history, including a 2022 MRI showing degenerative changes in your spine. We also discussed your intermittent fevers and night sweats, which may indicate a potential infection.  YOUR PLAN: -LUMBAR RADICULOPATHY WITH DEGENERATIVE DISC DISEASE: Lumbar radiculopathy with degenerative disc disease means that the nerves in your lower back are being compressed or irritated due to wear and tear of the spinal discs. We will start with physical therapy to help unpinch the nerves and improve alignment. If this is not effective, we may consider radiofrequency ablation or spinal cord stimulation. We have prescribed Lyrica  to replace gabapentin for your neuropathic pain, Zanaflex  as a muscle relaxer, and Jurnavix pending insurance approval. An MRI of your thoracic and lumbar spine with contrast is ordered to rule out infection and assess degenerative changes. You are also referred to local pain management and physical medicine and rehabilitation for specialized back pain management. Weight loss is encouraged to potentially improve your symptoms.  -CHRONIC THORACIC BACK PAIN: Chronic thoracic back pain refers to persistent pain in the upper and middle back. This may be related to muscle involvement and is exacerbated by certain positions. We recommend physical therapy and dry needling to help reprogram nerves and improve alignment. You are referred to physical medicine and rehabilitation for specialized back pain management.  -FEVER, RULE OUT SPINAL INFECTION: Your intermittent fevers and night sweats may indicate a potential spinal infection. We have ordered an  MRI of your thoracic and lumbar spine with contrast to rule out this possibility. Lab work is also ordered to assess your kidney function before the MRI. We will monitor for signs of infection and consider antibiotics if an infection is confirmed.  INSTRUCTIONS: Please schedule an MRI of your thoracic and lumbar spine with contrast and complete the lab work to assess kidney function prior to the MRI. Follow up with local pain management and physical medicine and rehabilitation for specialized back pain management. Begin physical therapy as recommended. Monitor for any signs of infection and report them immediately. Continue with the prescribed medications and consult with your insurance regarding coverage for Jurnavix.  Your Providers PCP: Patel Samuel MATSU, MD,  [phone]) Referring Provider: Cone Samuel MATSU, MD,  [phone]) Care Team Provider: Burnard Debby LABOR, MD Care Team Provider: Shlomo Wilbert SAUNDERS, MD,  [phone])  NEXT STEPS: [x]  Early Intervention: Schedule sooner appointment, call our on-call services, or go to emergency room if there is any significant Increase in pain or discomfort New or worsening symptoms Sudden or severe changes in your health [x]  Flexible Follow-Up: We recommend a Return in about 1 month (around 09/05/2024). for optimal routine care. This allows for progress monitoring and treatment adjustments. [x]  Preventive Care: Schedule your annual preventive care visit! It's typically covered by insurance and helps identify potential health issues early. [x]  Lab & X-ray Appointments: Incomplete tests scheduled today, or call to schedule. X-rays:  [facility] (M-F, 8:30am-noon or 1pm-5pm). [x]  Medical Information Release: Sign a release form at front desk to obtain relevant medical information we don't have.  MAKING THE MOST OF OUR FOCUSED 20 MINUTE APPOINTMENTS: [x]   Clearly state your top concerns at the beginning of the visit to  focus our  discussion [x]   If you anticipate you will need more time, please inform the front desk during scheduling - we can book multiple appointments in the same week. [x]   If you have transportation problems- use our convenient video appointments or ask about transportation support. [x]   We can get down to business faster if you use MyChart to update information before the visit and submit non-urgent questions before your visit. Thank you for taking the time to provide details through MyChart.  Let our nurse know and she can import this information into your encounter documents.  Arrival and Wait Times: [x]   Arriving on time ensures that everyone receives prompt attention. [x]   Early morning (8a) and afternoon (1p) appointments tend to have shortest wait times. [x]   Unfortunately, we cannot delay appointments for late arrivals or hold slots during phone calls.  Getting Answers and Following Up [x]   Simple Questions & Concerns: For quick questions or basic follow-up after your visit, reach us  at [phone] or MyChart messaging. [x]   Complex Concerns: If your concern is more complex, scheduling an appointment might be best. Discuss this with the staff to find the most suitable option. [x]   Lab & Imaging Results: We'll contact you directly if results are abnormal or you don't use MyChart. Most normal results will be on MyChart within 2-3 business days, with a review message from Dr. Jesus. Haven't heard back in 2 weeks? Need results sooner? Contact us  at [phone]. [x]   Referrals: Our referral coordinator will manage specialist referrals. The specialist's office should contact you within 2 weeks to schedule an appointment. Call us  if you haven't heard from them after 2 weeks.  Staying Connected [x]   MyChart: Activate your MyChart for the fastest way to access results and message us . See the last page of this paperwork for instructions on how to activate.  Bring to Your Next Appointment [x]    Medications: Please bring all your medication bottles to your next appointment to ensure we have an accurate record of your prescriptions. [x]   Health Diaries: If you're monitoring any health conditions at home, keeping a diary of your readings can be very helpful for discussions at your next appointment.  Billing [x]   X-ray & Lab Orders: These are billed by separate companies. Contact the invoicing company directly for questions or concerns. [x]   Visit Charges: Discuss any billing inquiries with our administrative services team.  Your Satisfaction Matters [x]   Share Your Experience: We strive for your satisfaction! If you have any complaints, or preferably compliments, please let Dr. Jesus know directly or contact our Practice Administrators, Manuelita Rubin or [employer], by asking at the front desk.   Reviewing Your Records [x]   Review this early draft of your clinical encounter notes below and the final encounter summary tomorrow on MyChart after its been completed.  All orders placed so far are visible here: Chronic bilateral thoracic back pain -     Ambulatory referral to Pain Clinic -     Ambulatory referral to Physical Medicine Rehab -     MR LUMBAR SPINE WO CONTRAST; Future -     MR THORACIC SPINE WO CONTRAST; Future -     Ambulatory referral to Physical Therapy -     Journavx ; Take 1 tablet by mouth 2 (two) times daily as needed.  Dispense: 60 tablet; Refill: 5 -     Celecoxib ; Take 1 capsule (200 mg total) by mouth 2 (two) times daily.  Dispense: 180 capsule; Refill:  4 -     tiZANidine  HCl; Take 1 tablet (4 mg total) by mouth every 6 (six) hours as needed for muscle spasms.  Dispense: 60 tablet; Refill: 2 -     MR LUMBAR SPINE W WO CONTRAST; Future -     MR THORACIC SPINE W WO CONTRAST; Future -     Basic metabolic panel with GFR  Lumbar back pain with radiculopathy affecting right lower extremity -     Ambulatory referral to Pain Clinic -     Ambulatory referral to  Physical Medicine Rehab -     MR LUMBAR SPINE WO CONTRAST; Future -     MR THORACIC SPINE WO CONTRAST; Future -     Ambulatory referral to Physical Therapy -     Journavx ; Take 1 tablet by mouth 2 (two) times daily as needed.  Dispense: 60 tablet; Refill: 5 -     Pregabalin ; Take 1 capsule (200 mg total) by mouth 2 (two) times daily. Replaces gabapentin  Dispense: 60 capsule; Refill: 2 -     MR LUMBAR SPINE W WO CONTRAST; Future -     MR THORACIC SPINE W WO CONTRAST; Future -     Basic metabolic panel with GFR       RED FLAG WARNING SIGNS - GO TO EMERGENCY ROOM IF YOU DEVELOP: New weakness or numbness in legs Loss of bladder or bowel control Saddle anesthesia (numbness in groin/rectal area) Severe, progressive, or disabling pain despite treatment Fever over 101F with back pain Sudden onset of severe tearing/ripping pain

## 2024-08-05 NOTE — Progress Notes (Signed)
 ==============================  [facility]: [phone]   -- Medical Office Visit --  Patient: Samuel Patel      Age: 59 y.o.       Sex:  male  Date:   08/05/2024 Today's Healthcare Provider: Bernardino KANDICE Cone, MD  ==============================   Chief Complaint: Back Pain (Has been going on more in the past two weeks )   Discussed the use of AI scribe software for clinical note transcription with the patient, who gave verbal consent to proceed.  History of Present Illness  59 year old male with degenerative disc disease who presents with worsening back pain and neuropathic symptoms.  He experiences a burning sensation, described as searing and similar to a sunburn, primarily on the right side, extending from his lower back to his leg. This sensation radiates to his neck and upper back, causing an achy feeling akin to having done extensive physical activity, despite no unusual activities. The burning sensation is particularly bothersome when sitting or standing for extended periods, necessitating frequent stretching to alleviate discomfort.  The lower back pain has worsened over time, making it difficult to sit or stand for long durations without significant discomfort. He has a history of receiving injections from a pain specialist several years ago, which did not provide relief. Currently, he takes 900 mg of gabapentin at once, which has not alleviated his symptoms. He has also tried muscle relaxers in the past without success.  In 2022, an MRI of his lumbar spine revealed degenerative changes, including disc wear at L1-L2, bone spurs, and mild disc narrowing and bulging at L3-L4. He reports that these issues have worsened since then, contributing to his current symptoms. No recent falls, weakness, or urinary incontinence. He has not experienced any significant weight loss, although he wishes he had. He wakes up drenched in sweat, raising concern for  possible infection.  He has tried various medications, including Celebrex , which he takes at 200 mg daily, but it has not provided significant relief. He has also used topical treatments like capsaicin without benefit.  Background Reviewed: Problem List: has Atrial fibrillation (HCC); Tobacco abuse; Anxiety; Dyslipidemia; Obesity (BMI 35.0-39.9 without comorbidity); Caffeine use; RLS (restless legs syndrome); Gout; Prediabetes; OSA (obstructive sleep apnea); Primary hypertension; and High risk sexual behavior on their problem list. Past Medical History:  has a past medical history of Anxiety, Asthma, Atrial fibrillation (HCC), Depression, Elevated cholesterol, Hypertension, and OSA (obstructive sleep apnea). Past Surgical History:   has a past surgical history that includes Appendectomy; Cholecystectomy; and Total shoulder replacement (Left). Social History:   reports that he has been smoking cigarettes. He started smoking about a year ago. He has a 0.3 pack-year smoking history. He has never used smokeless tobacco. He reports current alcohol use. He reports that he does not use drugs. Family History:  family history includes Asthma in his mother; Cancer in his father; Depression in his brother, daughter, and sister; Diabetes in his mother; Hyperlipidemia in his mother; Hypertension in his mother. Allergies:  is allergic to penicillins.   Medication Reconciliation: Current Outpatient Medications on File Prior to Visit  Medication Sig   amLODipine  (NORVASC ) 5 MG tablet Take 1 tablet (5 mg total) by mouth daily.   atorvastatin  (LIPITOR) 10 MG tablet Take 1 tablet (10 mg total) by mouth daily.   emtricitabine -tenofovir  (TRUVADA) 200-300 MG tablet Take 1 tablet by mouth daily.   metoprolol  succinate (TOPROL -XL) 25 MG 24 hr tablet Take 1 tablet (25 mg total)  by mouth daily.   olmesartan  (BENICAR ) 40 MG tablet Take 1 tablet (40 mg total) by mouth daily.   PARoxetine  (PAXIL ) 40 MG tablet Take 1 tablet  (40 mg total) by mouth every morning.   No current facility-administered medications on file prior to visit.   Medications Discontinued During This Encounter  Medication Reason   celecoxib  (CELEBREX ) 200 MG capsule Dose change     Physical Exam:    08/05/2024    8:39 AM 06/07/2024    9:34 AM 05/11/2024   10:01 AM  Vitals with BMI  Height 5' 11.5 5' 11.5 5' 11.5  Weight 276 lbs 3 oz 283 lbs 13 oz 285 lbs 3 oz  BMI 37.99 39.03 39.23  Systolic 136 136 871  Diastolic 72 78 86  Pulse 72 78 70  Vital signs reviewed.  Nursing notes reviewed. Weight trend reviewed. Physical Activity: Sufficiently Active (08/02/2024)   Exercise Vital Sign    Days of Exercise per Week: 3 days    Minutes of Exercise per Session: 50 min   General Appearance:  No acute distress appreciable.   Well-groomed, healthy-appearing male.  Well proportioned with no abnormal fat distribution.  Good muscle tone. Pulmonary:  Normal work of breathing at rest, no respiratory distress apparent. SpO2: 98 %  Musculoskeletal: All extremities are intact.  Neurological:  Awake, alert, oriented, and engaged.  No obvious focal neurological deficits or cognitive impairments.  Sensorium seems unclouded.   Speech is clear and coherent with logical content. Psychiatric:  Appropriate mood, pleasant and cooperative demeanor, thoughtful and engaged during the exam   Verbalized to patient: Physical Exam    Results:   Verbalized to patient: Results Radiology Lumbar spine MRI (2022): Disc degeneration at L1-L2 with disc space narrowing and ventral spondylitic spur; mild disc narrowing and bulging at L3-L4 with osteophyte formation; multiple osteophytes throughout lumbar spine; facet arthropathy, worse on the left; findings consistent with degenerative disc disease and nerve root impingement.     05/11/2024   10:06 AM 03/16/2024    9:54 AM  PHQ 2/9 Scores  PHQ - 2 Score 0 0    Office Visit on 08/05/2024  Component Date  Value Ref Range Status   Sodium 08/05/2024 137  135 - 145 mEq/L Final   Potassium 08/05/2024 4.6  3.5 - 5.1 mEq/L Final   Chloride 08/05/2024 104  96 - 112 mEq/L Final   CO2 08/05/2024 29  19 - 32 mEq/L Final   Glucose, Bld 08/05/2024 94  70 - 99 mg/dL Final   BUN 98/84/7973 12  6 - 23 mg/dL Final   Creatinine, Ser 08/05/2024 1.18  0.40 - 1.50 mg/dL Final   GFR 98/84/7973 68.97  >60.00 mL/min Final   Calcium  08/05/2024 9.5  8.4 - 10.5 mg/dL Final  Orders Only on 88/96/7974  Component Date Value Ref Range Status   Genetic Analysis Overall Interpret* 07/05/2024 Negative   Final   Genetic Disease Assessed 07/05/2024    Final                   Value:This is a screening test and does not detect all pathogenic or likely pathogenic variant(s) in the tested genes; diagnostic testing is recommended for individuals with a personal or family history of heart disease or hereditary cancer. [employer]  Population Screen is a screening test that analyzes 11 genes related to hereditary breast and ovarian cancer (HBOC) syndrome, Lynch syndrome, and familial hypercholesterolemia. This test only reports clinically significant pathogenic  and likely  pathogenic variants but does not report variants of uncertain significance (VUS). In addition, analysis of the PMS2 gene excludes exons 11-15, which overlap with a known pseudogene (PMS2CL).    Genetic Analysis Report 07/05/2024    Final                   Value:No pathogenic or likely pathogenic variants were detected in the genes analyzed by this test.Genetic test results should be interpreted in the context of an individual's personal medical and family history. Alteration to medical management is NOT  recommended based solely on this result. Clinical correlation is advised.Additional Considerations- This is a screening test; individuals may still carry pathogenic or likely pathogenic variant(s) in the tested genes that are not detected by this test.-  For  individuals at risk for these or other related conditions based on factors including personal or family history, diagnostic testing is recommended.- The absence of pathogenic or likely pathogenic variant(s) in the analyzed genes, while reassuring,  does not eliminate the possibility of a hereditary condition; there are other variants and genes associated with heart disease and hereditary cancer that are not included in this test.    Genes Tested 07/05/2024 See Notes   Final   Disclaimer 07/05/2024 See Notes   Final   Sequencing Location 07/05/2024 See Notes   Final   Interpretation Methods and Limitat* 07/05/2024 See Notes   Final  Patient Message on 05/13/2024  Component Date Value Ref Range Status   COLOGUARD 05/29/2024 Negative  Negative Final  Office Visit on 04/20/2024  Component Date Value Ref Range Status   Cholesterol 04/20/2024 147  0 - 200 mg/dL Final   Triglycerides 90/69/7974 263.0 (H)  0.0 - 149.0 mg/dL Final   HDL 90/69/7974 32.90 (L)  >60.99 mg/dL Final   VLDL 90/69/7974 52.6 (H)  0.0 - 40.0 mg/dL Final   LDL Cholesterol 04/20/2024 61  0 - 99 mg/dL Final   Total CHOL/HDL Ratio 04/20/2024 4   Final   NonHDL 04/20/2024 113.72   Final   Sodium 04/20/2024 138  135 - 145 mEq/L Final   Potassium 04/20/2024 4.2  3.5 - 5.1 mEq/L Final   Chloride 04/20/2024 104  96 - 112 mEq/L Final   CO2 04/20/2024 26  19 - 32 mEq/L Final   Glucose, Bld 04/20/2024 99  70 - 99 mg/dL Final   BUN 90/69/7974 13  6 - 23 mg/dL Final   Creatinine, Ser 04/20/2024 0.93  0.40 - 1.50 mg/dL Final   Total Bilirubin 04/20/2024 0.5  0.2 - 1.2 mg/dL Final   Alkaline Phosphatase 04/20/2024 62  39 - 117 U/L Final   AST 04/20/2024 18  0 - 37 U/L Final   ALT 04/20/2024 29  0 - 53 U/L Final   Total Protein 04/20/2024 6.8  6.0 - 8.3 g/dL Final   Albumin 90/69/7974 4.5  3.5 - 5.2 g/dL Final   GFR 90/69/7974 91.97  >60.00 mL/min Final   Calcium  04/20/2024 9.3  8.4 - 10.5 mg/dL Final   WBC 90/69/7974 5.6  4.0 - 10.5  K/uL Final   RBC 04/20/2024 4.53  4.22 - 5.81 Mil/uL Final   Hemoglobin 04/20/2024 14.3  13.0 - 17.0 g/dL Final   HCT 90/69/7974 41.6  39.0 - 52.0 % Final   MCV 04/20/2024 91.9  78.0 - 100.0 fl Final   MCHC 04/20/2024 34.4  30.0 - 36.0 g/dL Final   RDW 90/69/7974 14.6  11.5 - 15.5 % Final   Platelets  04/20/2024 210.0  150.0 - 400.0 K/uL Final   Neutrophils Relative % 04/20/2024 51.3  43.0 - 77.0 % Final   Lymphocytes Relative 04/20/2024 37.8  12.0 - 46.0 % Final   Monocytes Relative 04/20/2024 7.6  3.0 - 12.0 % Final   Eosinophils Relative 04/20/2024 2.4  0.0 - 5.0 % Final   Basophils Relative 04/20/2024 0.9  0.0 - 3.0 % Final   Neutro Abs 04/20/2024 2.9  1.4 - 7.7 K/uL Final   Lymphs Abs 04/20/2024 2.1  0.7 - 4.0 K/uL Final   Monocytes Absolute 04/20/2024 0.4  0.1 - 1.0 K/uL Final   Eosinophils Absolute 04/20/2024 0.1  0.0 - 0.7 K/uL Final   Basophils Absolute 04/20/2024 0.0  0.0 - 0.1 K/uL Final   TSH 04/20/2024 2.030  0.450 - 4.500 uIU/mL Final   Hgb A1c MFr Bld 04/20/2024 6.0  4.6 - 6.5 % Final   HIV FINAL INTERPRETATION 04/20/2024 HIV NEGATIVE   Final   HIV 1&2 Ab, 4th Generation 04/20/2024 NON-REACTIVE  NON-REACTIVE Final   HCV Ab 04/20/2024 Non Reactive  Non Reactive Final   PSA 04/20/2024 0.43  0.10 - 4.00 ng/mL Final   Neisseria Gonorrhea 04/20/2024 Negative   Final   Chlamydia 04/20/2024 Negative   Final   Comment 04/20/2024 Normal Reference Ranger Chlamydia - Negative   Final   Comment 04/20/2024 Normal Reference Range Neisseria Gonorrhea - Negative   Final   HCV Interp 1: 04/20/2024 Comment   Final  Admission on 08/31/2022, Discharged on 08/31/2022  Component Date Value Ref Range Status   WBC 08/31/2022 8.8  4.0 - 10.5 K/uL Final   RBC 08/31/2022 4.66  4.22 - 5.81 MIL/uL Final   Hemoglobin 08/31/2022 14.8  13.0 - 17.0 g/dL Final   HCT 97/89/7975 42.5  39.0 - 52.0 % Final   MCV 08/31/2022 91.2  80.0 - 100.0 fL Final   MCH 08/31/2022 31.8  26.0 - 34.0 pg Final   MCHC  08/31/2022 34.8  30.0 - 36.0 g/dL Final   RDW 97/89/7975 13.5  11.5 - 15.5 % Final   Platelets 08/31/2022 219  150 - 400 K/uL Final   nRBC 08/31/2022 0.0  0.0 - 0.2 % Final   Neutrophils Relative % 08/31/2022 67  % Final   Neutro Abs 08/31/2022 6.0  1.7 - 7.7 K/uL Final   Lymphocytes Relative 08/31/2022 23  % Final   Lymphs Abs 08/31/2022 2.0  0.7 - 4.0 K/uL Final   Monocytes Relative 08/31/2022 7  % Final   Monocytes Absolute 08/31/2022 0.6  0.1 - 1.0 K/uL Final   Eosinophils Relative 08/31/2022 2  % Final   Eosinophils Absolute 08/31/2022 0.2  0.0 - 0.5 K/uL Final   Basophils Relative 08/31/2022 1  % Final   Basophils Absolute 08/31/2022 0.0  0.0 - 0.1 K/uL Final   Immature Granulocytes 08/31/2022 0  % Final   Abs Immature Granulocytes 08/31/2022 0.01  0.00 - 0.07 K/uL Final   Sodium 08/31/2022 137  135 - 145 mmol/L Final   Potassium 08/31/2022 4.1  3.5 - 5.1 mmol/L Final   Chloride 08/31/2022 102  98 - 111 mmol/L Final   CO2 08/31/2022 25  22 - 32 mmol/L Final   Glucose, Bld 08/31/2022 98  70 - 99 mg/dL Final   BUN 97/89/7975 18  6 - 20 mg/dL Final   Creatinine, Ser 08/31/2022 1.09  0.61 - 1.24 mg/dL Final   Calcium  08/31/2022 9.4  8.9 - 10.3 mg/dL Final   Total Protein  08/31/2022 7.3  6.5 - 8.1 g/dL Final   Albumin 97/89/7975 4.6  3.5 - 5.0 g/dL Final   AST 97/89/7975 17  15 - 41 U/L Final   ALT 08/31/2022 34  0 - 44 U/L Final   Alkaline Phosphatase 08/31/2022 60  38 - 126 U/L Final   Total Bilirubin 08/31/2022 0.7  0.3 - 1.2 mg/dL Final   GFR, Estimated 08/31/2022 >60  >60 mL/min Final   Anion gap 08/31/2022 10  5 - 15 Final   Uric Acid, Serum 08/31/2022 7.1  3.7 - 8.6 mg/dL Final  No image results found. DG Knee Complete 4 Views Left Result Date: 05/11/2024 CLINICAL DATA:  Status post fall with pain, swelling, and bruising EXAM: LEFT KNEE - COMPLETE 4 VIEW COMPARISON:  None Available. FINDINGS: No evidence of fracture, dislocation, or joint effusion. Mild degenerative  changes of the knee. Soft tissues are unremarkable. Vascular calcifications. IMPRESSION: 1. No acute fracture or dislocation. 2. Mild degenerative changes of the knee. Electronically Signed   By: Limin  Xu M.D.   On: 05/11/2024 11:49         ASSESSMENT & PLAN   Assessment & Plan Chronic bilateral thoracic back pain Lumbar back pain with radiculopathy affecting right lower extremity  Lumbar radiculopathy with degenerative disc disease   He presents with chronic lumbar radiculopathy and degenerative disc disease, experiencing neuropathic pain radiating down the right leg, burning pain, and difficulty with prolonged sitting or standing. Previous treatments with gabapentin, NSAIDs, and muscle relaxers have been ineffective. A 2022 MRI shows degenerative changes with bone spurs and disc narrowing. There are no red flags such as bowel or bladder dysfunction, significant weight loss, or severe progressive pain despite treatment. Insurance requires physical therapy before MRI approval. Physical therapy may help unpinch nerves and improve alignment, though it is unlikely to reverse degenerative changes. Radiofrequency ablation and spinal cord stimulation are considered potential interventions. Jurnavix, a new non-opioid pain reliever, was discussed but may not be covered by insurance. An MRI of the thoracic and lumbar spine with contrast is ordered to rule out infection and assess degenerative changes. He is referred to local pain management for radiofrequency ablation and to physical medicine and rehabilitation for specialized back pain management. Lyrica  is prescribed to replace gabapentin for neuropathic pain, Zanaflex  as a muscle relaxer, and Jurnavix pending insurance approval. Weight loss is encouraged to potentially improve symptoms and facilitate other treatments.  Chronic thoracic back pain   He experiences chronic thoracic back pain, possibly related to trapezius muscle involvement, described as  aching and exacerbated by certain positions. Previous treatments have been ineffective. Physical therapy and dry needling are discussed as potential benefits to reprogram nerves and improve alignment. He is referred to physical medicine and rehabilitation for specialized back pain management and encouraged to pursue physical therapy to potentially unlock advanced treatments.  Fever, rule out spinal infection   He reports intermittent fevers and night sweats, possibly related to recent Ozempic injections, raising concern for a potential spinal infection contributing to worsening back pain. There are no definitive signs of infection, but the fevers and night sweats warrant further investigation. An MRI of the thoracic and lumbar spine with contrast is ordered to rule out spinal infection, and lab work is ordered to assess kidney function prior to the MRI with contrast. Monitoring for signs of infection is advised, and antibiotics will be considered if an infection is confirmed.  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 Pain characteristics (location, quality, radiation pattern) and physical exam findings support diagnosis. Pain is mechanical in nature, worsened with activity and improved with rest. Concerning possibility: Vertebral disk infection - wake up with fever, recent injections with soft tissue tenderness, no IVDU history Differential Diagnosis Excluded: Bone tumor - No palpable masses, no unexplained weight loss, no pain that worsens at night Acute fracture - No severe trauma, no significant bone loss, no point tenderness over vertebrae Aortic aneurysm - No hypertension history, no smoking history, absence of tearing/ripping sensations Pyelonephritis - No pain radiating to groin, no costovertebral angle  tenderness, no urinary symptoms Cauda equina syndrome - No weakness/numbness, no loss of bowel or bladder function  RISK ASSESSMENT: Low risk for serious pathology based on absence of red flags. Patient educated on emergency warning signs requiring immediate evaluation.  TREATMENT PLAN   Acute Pain Management Rest for 24-48 hours, then gradual return to activity as tolerated Cold packs for 20 minutes every 2-3 hours for first 48-72 hours Transition to heat therapy after 72 hours if preferred (no sleeping on heating pad) Acetaminophen  650mg  every 6 hours as needed for pain NSAIDs (if not contraindicated): Ibuprofen 600mg  every 6 hours with food and plenty of fluids Muscle relaxant: Cyclobenzaprine 5mg  at bedtime as needed for muscle spasm  Activity Modification Proper body mechanics reviewed for lifting techniques Avoid heavy lifting (>10 lbs) for 2 weeks Gradual return to normal activities as tolerated Maintain good posture during sitting, standing, and sleeping  Rehabilitation Home exercise program instructions provided in After Visit Summary Focus on gentle stretching and core strengthening exercises Physical Therapy referral placed for comprehensive evaluation and treatment Begin exercises once acute pain subsides (typically 3-5 days)  Diagnostics X-ray of lumbar spine ordered to assess for degenerative changes Advanced imaging not indicated at this time given absence of red flags Will reassess need for further workup based on treatment response  Follow-up Plan Return to clinic in 2-3 weeks if symptoms persist despite treatment Contact office sooner if symptoms worsen or if red flag symptoms develop Physical therapy to begin within 1-2 weeks   EVIDENCE-BASED RECOMMENDATIONS: Treatment plan follows current clinical guidelines from the [employer] and [employer]. Early mobility, appropriate pain control, and progressive exercise have demonstrated better  outcomes than prolonged rest or early imaging for non-specific back pain without red flags.      ORDER ASSOCIATIONS  #   DIAGNOSIS / CONDITION ICD-10 ENCOUNTER ORDER     ICD-10-CM   1. Chronic bilateral thoracic back pain  M54.6 Ambulatory referral to Pain Clinic   G89.29 Ambulatory referral to Physical Medicine Rehab    MR Lumbar Spine Wo Contrast    MR Thoracic Spine Wo Contrast    Ambulatory referral to Physical Therapy    Suzetrigine  (JOURNAVX ) 50 MG TABS    celecoxib  (CELEBREX ) 200 MG capsule    tiZANidine  (ZANAFLEX ) 4 MG tablet    MR LUMBAR SPINE W WO CONTRAST    MR THORACIC SPINE W WO CONTRAST    Basic Metabolic Panel (BMET)    2. Lumbar back pain with radiculopathy affecting right lower extremity  M54.16 Ambulatory referral to Pain Clinic    Ambulatory referral to Physical Medicine Rehab    MR Lumbar Spine Wo Contrast    MR Thoracic Spine Wo Contrast    Ambulatory referral to Physical Therapy    Suzetrigine  (JOURNAVX )  50 MG TABS    pregabalin  (LYRICA ) 200 MG capsule    MR LUMBAR SPINE W WO CONTRAST    MR THORACIC SPINE W WO CONTRAST    Basic Metabolic Panel (BMET)           Orders Placed in Encounter:   Lab Orders         Basic Metabolic Panel (BMET)     Imaging Orders         MR Lumbar Spine Wo Contrast         MR Thoracic Spine Wo Contrast         MR LUMBAR SPINE W WO CONTRAST         MR THORACIC SPINE W WO CONTRAST    Initially scheduled MRI without contrast prior to realization he might have spinal cord infection   Referral Orders         Ambulatory referral to Pain Clinic         Ambulatory referral to Physical Medicine Rehab         Ambulatory referral to Physical Therapy     Meds ordered this encounter  Medications   Suzetrigine  (JOURNAVX ) 50 MG TABS    Sig: Take 1 tablet by mouth 2 (two) times daily as needed.    Dispense:  60 tablet    Refill:  5   celecoxib  (CELEBREX ) 200 MG capsule    Sig: Take 1 capsule (200 mg total) by mouth 2 (two)  times daily.    Dispense:  180 capsule    Refill:  4   pregabalin  (LYRICA ) 200 MG capsule    Sig: Take 1 capsule (200 mg total) by mouth 2 (two) times daily. Replaces gabapentin    Dispense:  60 capsule    Refill:  2   tiZANidine  (ZANAFLEX ) 4 MG tablet    Sig: Take 1 tablet (4 mg total) by mouth every 6 (six) hours as needed for muscle spasms.    Dispense:  60 tablet    Refill:  2    On the day of the visit, I dedicated 37 minutes to both direct and indirect patient care activities.  The time was spent: History: I obtained, documented, and reviewed a thorough medical history. I reviewed the patient's reported symptoms and clarified their context and significance in relation to the current visit. Examination: I conducted a medically appropriate physical evaluation. Data Synthesis: I synthesized information for clinical decision-making. Communication: I communicated clinical status and plan to the patient and/or family/caregiver. Counseling & Education: I provided personalized counseling on condition and treatment. Documentation: Documenting clinical findings and medical decision-making, and creating and providing documentation for patient review. Treatment Plan: I worked collaboratively with the patient to formulate and communicate an individualized plan (including shared decision-making). Orders: I placed necessary orders (medications, labs, imaging, referrals) in the EMR..  This time was spent independently of any separately billable procedures. Please note that this statement is intended to provide a clear and comprehensive account of the time and services provided during the patient's visit.  The extended time spent was necessary to provide safe, effective, and comprehensive care due to the following factors:, Extensive Comorbidities: The patient's multiple chronic conditions necessitated careful coordination, monitoring, and integration of care plans., High-Stakes Clinical Decision-Making:  The evaluation included consideration of hospitalization and/or major surgery based on patient status and preferences., and Patient Requested In-Depth Education on Disease Management: At the patient's request, I provided additional time to thoroughly educate them on self management of their  chronic condition, review lifestyle modifications, and clarify follow up plans-time that exceeded typical encounter duration and was not separately billable.      This document was synthesized by artificial intelligence ([employer]) using HIPAA-compliant recording of the clinical interaction;   We discussed the use of AI scribe software for clinical note transcription with the patient, who gave verbal consent to proceed. additional Info: This encounter employed state-of-the-art, real-time, collaborative documentation. The patient actively reviewed and assisted in updating their electronic medical record on a shared screen, ensuring transparency and facilitating joint problem-solving for the problem list, overview, and plan. This approach promotes accurate, informed care. The treatment plan was discussed and reviewed in detail, including medication safety, potential side effects, and all patient questions. We confirmed understanding and comfort with the plan. Follow-up instructions were established, including contacting the office for any concerns, returning if symptoms worsen, persist, or new symptoms develop, and precautions for potential emergency department visits.

## 2024-08-13 ENCOUNTER — Other Ambulatory Visit: Payer: Self-pay | Admitting: Internal Medicine

## 2024-08-13 DIAGNOSIS — G8929 Other chronic pain: Secondary | ICD-10-CM

## 2024-08-16 ENCOUNTER — Institutional Professional Consult (permissible substitution) (INDEPENDENT_AMBULATORY_CARE_PROVIDER_SITE_OTHER): Admitting: Otolaryngology

## 2024-08-23 ENCOUNTER — Telehealth: Payer: Self-pay | Admitting: Otolaryngology

## 2024-08-23 NOTE — Telephone Encounter (Signed)
 Pt was r/s due to weather delay called 08-23-24

## 2024-08-24 ENCOUNTER — Encounter (INDEPENDENT_AMBULATORY_CARE_PROVIDER_SITE_OTHER): Payer: Self-pay | Admitting: Otolaryngology

## 2024-08-24 ENCOUNTER — Institutional Professional Consult (permissible substitution) (INDEPENDENT_AMBULATORY_CARE_PROVIDER_SITE_OTHER): Admitting: Otolaryngology

## 2024-08-24 ENCOUNTER — Ambulatory Visit (INDEPENDENT_AMBULATORY_CARE_PROVIDER_SITE_OTHER): Admitting: Otolaryngology

## 2024-08-24 VITALS — BP 105/66 | HR 90 | Temp 98.1°F | Ht 71.0 in | Wt 282.2 lb

## 2024-08-24 DIAGNOSIS — G4733 Obstructive sleep apnea (adult) (pediatric): Secondary | ICD-10-CM | POA: Diagnosis not present

## 2024-09-04 ENCOUNTER — Other Ambulatory Visit

## 2024-09-06 ENCOUNTER — Ambulatory Visit: Admitting: Internal Medicine

## 2025-04-22 ENCOUNTER — Encounter: Admitting: Internal Medicine
